# Patient Record
Sex: Male | Born: 1935 | Race: White | Hispanic: No | Smoking: Former smoker
Health system: Southern US, Community
[De-identification: ages and names within clinical notes are randomized; demographics above are authoritative.]

## PROBLEM LIST (undated history)

## (undated) DIAGNOSIS — I10 Essential (primary) hypertension: Secondary | ICD-10-CM

## (undated) DIAGNOSIS — E079 Disorder of thyroid, unspecified: Secondary | ICD-10-CM

## (undated) DIAGNOSIS — I639 Cerebral infarction, unspecified: Secondary | ICD-10-CM

## (undated) HISTORY — PX: CORONARY STENT PLACEMENT: SHX1402

## (undated) HISTORY — DX: Cerebral infarction, unspecified: I63.9

## (undated) HISTORY — DX: Disorder of thyroid, unspecified: E07.9

## (undated) HISTORY — DX: Essential (primary) hypertension: I10

---

## 1994-02-19 ENCOUNTER — Encounter (INDEPENDENT_AMBULATORY_CARE_PROVIDER_SITE_OTHER): Payer: Self-pay | Admitting: *Deleted

## 1994-02-20 ENCOUNTER — Encounter: Payer: Self-pay | Admitting: Gastroenterology

## 1997-05-31 ENCOUNTER — Encounter (INDEPENDENT_AMBULATORY_CARE_PROVIDER_SITE_OTHER): Payer: Self-pay | Admitting: *Deleted

## 2000-07-10 ENCOUNTER — Encounter (INDEPENDENT_AMBULATORY_CARE_PROVIDER_SITE_OTHER): Payer: Self-pay | Admitting: *Deleted

## 2004-06-26 ENCOUNTER — Encounter (INDEPENDENT_AMBULATORY_CARE_PROVIDER_SITE_OTHER): Payer: Self-pay | Admitting: *Deleted

## 2004-10-26 ENCOUNTER — Inpatient Hospital Stay (HOSPITAL_COMMUNITY): Admission: EM | Admit: 2004-10-26 | Discharge: 2004-10-27 | Payer: Self-pay | Admitting: *Deleted

## 2006-08-06 ENCOUNTER — Emergency Department (HOSPITAL_COMMUNITY): Admission: EM | Admit: 2006-08-06 | Discharge: 2006-08-06 | Payer: Self-pay | Admitting: Emergency Medicine

## 2006-08-26 ENCOUNTER — Ambulatory Visit (HOSPITAL_COMMUNITY): Admission: RE | Admit: 2006-08-26 | Discharge: 2006-08-26 | Payer: Self-pay | Admitting: *Deleted

## 2006-10-01 ENCOUNTER — Inpatient Hospital Stay (HOSPITAL_COMMUNITY): Admission: RE | Admit: 2006-10-01 | Discharge: 2006-10-08 | Payer: Self-pay | Admitting: *Deleted

## 2006-10-01 ENCOUNTER — Encounter (INDEPENDENT_AMBULATORY_CARE_PROVIDER_SITE_OTHER): Payer: Self-pay | Admitting: Specialist

## 2006-10-17 ENCOUNTER — Encounter: Payer: Self-pay | Admitting: Gastroenterology

## 2006-10-18 ENCOUNTER — Encounter: Payer: Self-pay | Admitting: Gastroenterology

## 2006-10-18 ENCOUNTER — Inpatient Hospital Stay (HOSPITAL_COMMUNITY): Admission: EM | Admit: 2006-10-18 | Discharge: 2006-10-23 | Payer: Self-pay | Admitting: Emergency Medicine

## 2006-10-20 ENCOUNTER — Encounter: Payer: Self-pay | Admitting: Gastroenterology

## 2006-10-20 ENCOUNTER — Encounter (INDEPENDENT_AMBULATORY_CARE_PROVIDER_SITE_OTHER): Payer: Self-pay | Admitting: *Deleted

## 2006-10-22 ENCOUNTER — Ambulatory Visit: Payer: Self-pay | Admitting: Gastroenterology

## 2006-10-23 ENCOUNTER — Encounter (INDEPENDENT_AMBULATORY_CARE_PROVIDER_SITE_OTHER): Payer: Self-pay | Admitting: *Deleted

## 2006-12-11 ENCOUNTER — Ambulatory Visit: Payer: Self-pay | Admitting: *Deleted

## 2006-12-30 ENCOUNTER — Ambulatory Visit: Payer: Self-pay | Admitting: Gastroenterology

## 2007-06-11 ENCOUNTER — Ambulatory Visit: Payer: Self-pay | Admitting: *Deleted

## 2007-06-11 ENCOUNTER — Encounter: Admission: RE | Admit: 2007-06-11 | Discharge: 2007-06-11 | Payer: Self-pay | Admitting: *Deleted

## 2009-05-30 ENCOUNTER — Encounter (INDEPENDENT_AMBULATORY_CARE_PROVIDER_SITE_OTHER): Payer: Self-pay | Admitting: *Deleted

## 2009-11-10 ENCOUNTER — Telehealth: Payer: Self-pay | Admitting: Gastroenterology

## 2009-11-10 ENCOUNTER — Encounter: Payer: Self-pay | Admitting: Gastroenterology

## 2009-12-07 ENCOUNTER — Ambulatory Visit: Payer: Self-pay | Admitting: Gastroenterology

## 2009-12-07 ENCOUNTER — Encounter (INDEPENDENT_AMBULATORY_CARE_PROVIDER_SITE_OTHER): Payer: Self-pay | Admitting: *Deleted

## 2009-12-07 DIAGNOSIS — Z860100 Personal history of colon polyps, unspecified: Secondary | ICD-10-CM | POA: Insufficient documentation

## 2009-12-07 DIAGNOSIS — I2510 Atherosclerotic heart disease of native coronary artery without angina pectoris: Secondary | ICD-10-CM | POA: Insufficient documentation

## 2009-12-07 DIAGNOSIS — K219 Gastro-esophageal reflux disease without esophagitis: Secondary | ICD-10-CM

## 2009-12-07 DIAGNOSIS — Z8711 Personal history of peptic ulcer disease: Secondary | ICD-10-CM

## 2009-12-14 ENCOUNTER — Encounter: Payer: Self-pay | Admitting: Gastroenterology

## 2010-05-31 ENCOUNTER — Inpatient Hospital Stay (HOSPITAL_COMMUNITY): Admission: EM | Admit: 2010-05-31 | Discharge: 2010-06-05 | Payer: Self-pay | Admitting: Emergency Medicine

## 2010-08-28 ENCOUNTER — Ambulatory Visit (HOSPITAL_COMMUNITY): Admission: RE | Admit: 2010-08-28 | Discharge: 2010-08-28 | Payer: Self-pay | Admitting: Ophthalmology

## 2010-09-11 ENCOUNTER — Ambulatory Visit (HOSPITAL_COMMUNITY): Admission: RE | Admit: 2010-09-11 | Discharge: 2010-09-11 | Payer: Self-pay | Admitting: Ophthalmology

## 2010-11-22 NOTE — Op Note (Signed)
Summary: [facility]   Imported By: Sherian Rein 12/07/2009 14:04:41  _____________________________________________________________________  External Attachment:    Type:   Image     Comment:   External Document

## 2010-11-22 NOTE — Procedures (Signed)
Summary: Colonoscopy   Colonoscopy  Procedure date:  07/10/2000  Findings:      Results: Polyp.  Results: Diverticulosis.       Location:  [facility].    Procedures Next Due Date:    Colonoscopy: 07/2004 Patient Name: Walter [name], Walter [name]. MRN: [id number] Procedure Procedures: Colonoscopy CPT: [phone].    with Hot Biopsy(s)CPT: [id number].  Personnel: Endoscopist: Ulyess Mort, MD.  Referred By: Desma Maxim, MD.  Exam Location: Exam performed in [facility]. Outpatient  Patient Consent: Procedure, Alternatives, Risks and Benefits discussed, consent obtained, from patient.  Indications  Surveillance of: Adenomatous Polyp(s). This is not an initial surveillance exam. 1-2 Polyps were found at Index Exam. Largest polyp removed was 10 to 19 mm. Prior polyp located in distal colon. Pathology of worst  polyp: tubular adenoma.  Exam Exam: Extent of exam reached: Cecum, extent intended: Cecum.  The cecum was identified by appendiceal orifice and IC valve. Patient position: on left side. Duration of exam: 30 minutes. Colon retroflexion performed. Images taken. ASA Classification: II. Tolerance: good.  Monitoring: Pulse and BP monitoring, Oximetry used. Supplemental O2 given.  Colon Prep Used Phospho Soda for colon prep.  Fluoroscopy: Fluoroscopy was not used.  Sedation Meds: Fentanyl 75 mcg. Versed 7.5 mg.  Findings POLYP: Ascending Colon, Maximum size: 4 mm. sessile polyp. Procedure:  hot biopsy, removed, not retrieved, ICD9: Colon Polyps: 211.3.  DIVERTICULOSIS: Descending Colon. ICD9: Diverticulosis: 562.10. Comments: moderate.   Assessment Abnormal examination, see findings above.  Diagnoses: 211.3: Colon Polyps.  562.10: Diverticulosis.   Events  Unplanned Interventions: No intervention was required.  Plans  Post Exam Instructions: Home hemoccult tests to be obtained, CPT: Hm. Hemoccult.  Patient Education: Patient given standard instructions  for: Polyps. Diverticulosis. Patient instructed to get routine colonoscopy every 4 years.  Disposition: After procedure patient sent to recovery. After recovery patient sent home.  This report was created from the original endoscopy report, which was reviewed and signed by the above listed endoscopist.    cc: Donia Guiles, MD

## 2010-11-22 NOTE — Procedures (Signed)
Summary: EGD   EGD  Procedure date:  10/18/2006  Findings:      Findings: Esophagitis  Findings: Ulcer  Location: [facility]    Procedures Next Due Date:    EGD: 12/2006  EGD  Procedure date:  10/18/2006  Findings:      Findings: Esophagitis  Findings: Ulcer  Location: [facility]    Procedures Next Due Date:    EGD: 12/2006 Patient Name: Walter [name], Walter [name]. MRN: [id number] Procedure Procedures: Panendoscopy (EGD) CPT: 43235.  Personnel: Endoscopist: Venita Lick. Russella Dar, MD, Clementeen Graham.  Referred By: Bud Face, MD.  Exam Location: Exam performed in Endoscopy Suite. Inpatient-ICU  Patient Consent: Procedure, Alternatives, Risks and Benefits discussed, consent obtained, from family. Consent was obtained by the RN.  Indications  Evaluation of Suspected: Upper GI bleed.  History  Current Medications: Patient is not currently taking Coumadin.  Pre-Exam Physical: Performed Oct 17, 2006  Cardio-pulmonary exam, HEENT exam, Abdominal exam, Mental status exam WNL.  Comments: Pt. history reviewed/updated, physical exam performed prior to initiation of sedation?Yes Exam Exam Info: Maximum depth of insertion Duodenum, intended Duodenum. Vocal cords not visualized. Gastric retroflexion performed. Images taken. ASA Classification: III. Tolerance: excellent.  Sedation Meds: Patient assessed and found to be appropriate for moderate (conscious) sedation. No sedation was given. The patient was intubated.  Monitoring: BP and pulse monitoring done. Oximetry used. Supplemental O2 given  Findings Normal: Proximal Esophagus to Mid Esophagus.  - BLOOD CLOT: found in Fundus. ICD9: .  ESOPHAGEAL INFLAMMATION: Severity is moderate, erosions present.  [place] Classification: Grade B. ICD9: Esophagitis, Reflux: 530.11.  OTHER FINDING: Linear erythema in Cardia. Comments: c/w NG tube suction marks.  ULCER: in Duodenal Bulb Maximum size: 5 mm. Not bleeding, clear  ulcer base. An image was taken. ICD9: Ulcer, Duodenal, Acute without Hemorrhage: 532.30.  ULCER: in Body Maximum size: 4 mm. Adherent clot, not bleeding. The ulcer was washed with water. An image was taken. ICD9: Ulcer, Gastric, Acute without Hemorrhage: 531.30.  Normal: Antrum to Pyloric Sphincter.  ULCER: in Body Maximum size: 5 mm. Adherent clot, not bleeding. An image was taken. ICD9: Ulcer, Gastric, Acute without Hemorrhage: 531. 30.  Normal: Duodenal 2nd Portion.    Comments: Dark clots and old blood in stomach. The patient was repositioned with an adequate view of all areas except a small portion of the fundus. Assessment  Diagnoses: 531.30: Ulcer, Gastric, Acute without Hemorrhage.  532.30: Ulcer, Duodenal, Acute without Hemorrhage.  530.11: Esophagitis, Reflux.   Events  Unplanned Intervention: No unplanned interventions were required.  Unplanned Events: There were no complications. Plans Medication(s): PPI: IV infusion starting Oct 18, 2006  PPI: BID, starting Oct 21, 2006 for 4 wks.  PPI: QAM, starting Nov 18, 2006 for indefinitely.   Patient Education: Patient given standard instructions for: Ulcer. Reflux.  Comments: DC Naprosyn and avoid all other NSAIDs. Hold ASA and Plavix for at least 2 weeks. Disposition: After procedure patient sent remain in ICU.  Scheduling: EGD, to [employer]. Russella Dar, MD, [facility], around Dec 18, 2006.  Blood Tests, H. pylori Ab, treat if positive Oct 18, 2006.    This report was created from the original endoscopy report, which was reviewed and signed by the above listed endoscopist.    cc: Bud Face, MD         RUT: positive and treated

## 2010-11-22 NOTE — Procedures (Signed)
Summary: Colonoscopy   Colonoscopy  Procedure date:  06/26/2004  Findings:      Results: Diverticulosis.       Location:  [facility].    Procedures Next Due Date:    Colonoscopy: 06/2011  Colonoscopy  Procedure date:  06/26/2004  Findings:      Results: Diverticulosis.       Location:  [facility].    Procedures Next Due Date:    Colonoscopy: 06/2011 Patient Name: Walter [name], Walter [name] MRN: [id number] Procedure Procedures: Colonoscopy CPT: 54098.  Personnel: Endoscopist: Ulyess Mort, MD.  Exam Location: Exam performed in Outpatient Clinic. Outpatient  Patient Consent: Procedure, Alternatives, Risks and Benefits discussed, consent obtained, from patient. Consent was obtained by the RN.  Indications  Surveillance of: Adenomatous Polyp(s).  History  Current Medications: Patient is not currently taking Coumadin.  Pre-Exam Physical: Performed Jun 04, 2002. Cardio-pulmonary exam, Rectal exam, HEENT exam , Abdominal exam, Neurological exam, Mental status exam WNL.  Exam Exam: Extent of exam reached: Cecum, extent intended: Cecum.  The cecum was identified by appendiceal orifice and IC valve. Colon retroflexion performed. Images were not taken. ASA Classification: II. Tolerance: good.  Monitoring: Pulse and BP monitoring, Oximetry used. Supplemental O2 given.  Colon Prep Prep results: fair, exam compromised.  Sedation Meds: Patient assessed and found to be appropriate for moderate (conscious) sedation. Fentanyl 50 mcg. given IV. Versed 5 mg. given IV.  Findings - DIVERTICULOSIS: Ascending Colon to Sigmoid Colon. ICD9: Diverticulosis: 562.10. Comments: moderately severe.   Assessment Abnormal examination, see findings above.  Diagnoses: 562.10: Diverticulosis.   Events  Unplanned Interventions: No intervention was required.  Unplanned Events: There were no complications. Plans Medication Plan: Continue current  medications.  Patient Education: Patient given standard instructions for: Diverticulosis. Yearly hemoccult testing recommended. Patient instructed to get routine colonoscopy every 7 years.  Disposition: After procedure patient sent to recovery. After recovery patient sent home.  This report was created from the original endoscopy report, which was reviewed and signed by the above listed endoscopist.    cc: Donia Guiles, MD

## 2010-11-22 NOTE — Letter (Signed)
Summary: New Patient letter  [facility]  [address] [place], [place] [address]   Phone: [phone]  Fax: [phone]       11/10/2009 MRN: [id number]  [facility] [address] [place], [place]  [address]  Dear Walter [name],  Welcome to the Gastroenterology Division at [facility].    You are scheduled to see Dr.  Claudette Head on 12/07/09 (Thursday) at 10:00 a.m. on the 3rd floor at [facility], [address]  We ask that you try to arrive at our office 15 minutes prior to your appointment time to allow for check-in.  We would like you to complete the enclosed self-administered evaluation form prior to your visit and bring it with you on the day of your appointment.  We will review it with you.  Also, please bring a complete list of all your medications or, if you prefer, bring the medication bottles and we will list them.  Please bring your insurance card so that we may make a copy of it.  If your insurance requires a referral to see a specialist, please bring your referral form from your primary care physician.  Co-payments are due at the time of your visit and may be paid by cash, check or credit card.     Your office visit will consist of a consult with your physician (includes a physical exam), any laboratory testing he/she may order, scheduling of any necessary diagnostic testing (e.g. x-ray, ultrasound, CT-scan), and scheduling of a procedure (e.g. Endoscopy, Colonoscopy) if required.  Please allow enough time on your schedule to allow for any/all of these possibilities.    If you cannot keep your appointment, please call [phone] to cancel or reschedule prior to your appointment date.  This allows [place] the opportunity to schedule an appointment for another patient in need of care.  If you do not cancel or reschedule by 5 p.m. the business day prior to your appointment date, you will be charged a $50.00 late cancellation/no-show fee.     Thank you for choosing [facility] for your medical needs.  We appreciate the opportunity to care for you.  Please visit [place] at our website  to learn more about our practice.                     Sincerely,                                                             The Gastroenterology Division

## 2010-11-22 NOTE — Assessment & Plan Note (Signed)
Summary: discuss colonoscopy on plavix at age 77/dn   History of Present Illness Visit Type: new patient  Primary GI MD: Elie Goody MD [facility] Primary Provider: Lupita Raider, MD  Requesting Provider: n/a Chief Complaint: Consult colon. Pt is on Plavix. Pt denies any GI complaints  History of Present Illness:   Walter [name] is an 77 year old male with a history of adenomatous colon polyps, initially diagnosed in 1995. He has also had bleeding gastric and duodenal ulcers  secondary to NSAID usage with a history of a severe bleed in December 2007. Helicobacter pylori was treated.   GI Review of Systems      Denies abdominal pain, acid reflux, belching, bloating, chest pain, dysphagia with liquids, dysphagia with solids, heartburn, loss of appetite, nausea, vomiting, vomiting blood, weight loss, and  weight gain.        Denies anal fissure, black tarry stools, change in bowel habit, constipation, diarrhea, diverticulosis, fecal incontinence, heme positive stool, hemorrhoids, irritable bowel syndrome, jaundice, light color stool, liver problems, rectal bleeding, and  rectal pain.   Current Medications (verified): 1)  Protonix 40 Mg Tbec (Pantoprazole Sodium) .... One Tablet By Mouth Once Daily 2)  Levothyroxine Sodium 75 Mcg Tabs (Levothyroxine Sodium) .... One Tablet By Mouth Once Daily 3)  Metoprolol Tartrate 50 Mg Tabs (Metoprolol Tartrate) .... One Tablet By Mouth Once Daily 4)  Plavix 75 Mg Tabs (Clopidogrel Bisulfate) .... One Tablet By Mouth Once Daily 5)  Lipitor 40 Mg Tabs (Atorvastatin Calcium) .... One Tablet By Mouth Once Daily 6)  Allopurinol 300 Mg Tabs (Allopurinol) .... One Tablet By Mouth Once Daily  Allergies (verified): 1)  ! Penicillin  Past History:  Past Medical History: Adenomatous Colon Polyps 02/1994 Reflux esophagitis Gastric, Duodenal ulcers with bleed, 09/2006 H. Pylori antibody pos., treated in 09/2006 Diverticulosis Hypothyroidism Coronary Artery  Disease Hyperlipidemia Hypertension Allergic rhinitis  Past Surgical History: AAA repair  T & A Right ear tube Exp laparotomy with small bowel enterotomy 09/2006 Percutaneous coronary intervention, stent placed in 2006  Family History: No FH of Colon Cancer:  Social History: Occupation: Retired Widowed No childern Patient is a former smoker.  Alcohol Use - no Illicit Drug Use - no Smoking Status:  quit Drug Use:  no  Review of Systems       The pertinent positives and negatives are noted as above and in the HPI. All other ROS were reviewed and were negative.   Vital Signs:  Patient profile:   77 year old male Height:      71 inches Weight:      222 pounds BMI:     31.07 BSA:     2.21 Pulse rate:   68 / minute Pulse rhythm:   regular BP sitting:   128 / 74  (left arm) Cuff size:   regular  Vitals Entered By: Ok Anis CMA (December 07, 2009 9:47 AM)  Physical Exam  General:  Well developed, well nourished, no acute distress. Head:  Normocephalic and atraumatic. Eyes:  PERRLA, no icterus. Mouth:  No deformity or lesions, dentition normal. Lungs:  Clear throughout to auscultation. Heart:  Regular rate and rhythm; no murmurs, rubs,  or bruits. Abdomen:  Soft, nontender and nondistended. No masses, hepatosplenomegaly or hernias noted. Normal bowel sounds. Psych:  Alert and cooperative. Normal mood and affect.  Impression & Recommendations:  Problem # 1:  PERSONAL HISTORY OF COLONIC POLYPS (ICD-V12.72) Personal history of adenomatous colon polyps. His overall health status is good and he  would like to proceed with surveillance colonoscopy. The risks, benefits, and alternatives to 5 day hold Plavix were discussed with the patient and he consents to proceed. This will be cleared through his cardiologist. The risks, benefits and alternatives to colonoscopy with possible biopsy and possible polypectomy were discussed with the patient and they consent to proceed. The  procedure will be scheduled electively. Orders: Colonoscopy (Colon)  Problem # 2:  PEPTIC ULCER, ACUTE, HEMORRHAGE, HX OF (ICD-V12.71) Long-term avoidance of ASA and NSAID products if at all possible. If his cardiologist felt an 81 mg strength aspirin would be beneficial this would be reasonable as long as he continued a proton pump inhibitor on a daily basis. I would avoid any higher strength aspirin products or any other NSAID products long term. Continue Protonix 40 mg daily, long-term.  Problem # 3:  CORONARY ARTERY DISEASE (ICD-414.00) Priorcoronary artery stent placement. See problem #1.  Patient Instructions: 1)  Colonoscopy brochure given.  2)  Conscious Sedation brochure given.  3)  Copy sent to : Lupita Raider, MD 4)                         [name], MD 5)  The medication list was reviewed and reconciled.  All changed / newly prescribed medications were explained.  A complete medication list was provided to the patient / caregiver.  Prescriptions: MOVIPREP 100 GM  SOLR (PEG-KCL-NACL-NASULF-NA ASC-C) As per prep instructions.  #1 x 0   Entered by:   Christie Nottingham CMA (AAMA)   Authorized by:   Meryl Dare MD [facility]   Signed by:   Meryl Dare MD FACG on 12/07/2009   Method used:   Electronically to        [facility] (retail)       [address]       [place], [place]  [address]       Ph: [phone]       Fax: [phone]   RxID:   [id number]

## 2010-11-22 NOTE — Letter (Signed)
Summary: [facility] Instructions  [facility]  [address] [place], [place] [address]   Phone: [phone]  Fax: [phone]       Walter [name]    August 18, 1924    MRN: [id number]        Procedure Day /Date: Tuesday March 8th, 2011     Arrival Time: 3:00pm     Procedure Time: 4:00pm     Location of Procedure:                    _x _  [facility] (4th Floor)                        PREPARATION FOR COLONOSCOPY WITH MOVIPREP   Starting 5 days prior to your procedure 12/21/09  do not eat nuts, seeds, popcorn, corn, beans, peas,  salads, or any raw vegetables.  Do not take any fiber supplements (e.g. Metamucil, Citrucel, and Benefiber).  THE DAY BEFORE YOUR PROCEDURE         DATE: 12/25/09   DAY:  Monday   1.  Drink clear liquids the entire day-NO SOLID FOOD  2.  Do not drink anything colored red or purple.  Avoid juices with pulp.  No orange juice.  3.  Drink at least 64 oz. (8 glasses) of fluid/clear liquids during the day to prevent dehydration and help the prep work efficiently.  CLEAR LIQUIDS INCLUDE: Water Jello Ice Popsicles Tea (sugar ok, no milk/cream) Powdered fruit flavored drinks Coffee (sugar ok, no milk/cream) Gatorade Juice: apple, white grape, white cranberry  Lemonade Clear bullion, consomm, broth Carbonated beverages (any kind) Strained chicken noodle soup Hard Candy                             4.  In the morning, mix first dose of MoviPrep solution:    Empty 1 Pouch A and 1 Pouch B into the disposable container    Add lukewarm drinking water to the top line of the container. Mix to dissolve    Refrigerate (mixed solution should be used within 24 hrs)  5.  Begin drinking the prep at 5:00 p.m. The MoviPrep container is divided by 4 marks.   Every 15 minutes drink the solution down to the next mark (approximately 8 oz) until the full liter is complete.   6.  Follow completed prep with 16 oz of clear liquid of your choice  (Nothing red or purple).  Continue to drink clear liquids until bedtime.  7.  Before going to bed, mix second dose of MoviPrep solution:    Empty 1 Pouch A and 1 Pouch B into the disposable container    Add lukewarm drinking water to the top line of the container. Mix to dissolve    Refrigerate  THE DAY OF YOUR PROCEDURE      DATE:  12/26/09  DAY:  Tuesday  Beginning at   11:00 a.m. (5 hours before procedure):         1. Every 15 minutes, drink the solution down to the next mark (approx 8 oz) until the full liter is complete.  2. Follow completed prep with 16 oz. of clear liquid of your choice.    3. You may drink clear liquids until  2:00pm  (2 HOURS BEFORE PROCEDURE).   MEDICATION INSTRUCTIONS  Unless otherwise instructed, you should take regular prescription medications with a small sip of  water   as early as possible the morning of your procedure.  Diabetic patients - see separate instructions.  Stop taking Plavix or Aggrenox on  _  _  (5 days before procedure).     Stop taking Coumadin on  _ _  (5 days before procedure).  Additional medication instructions: You will be contacted by our office prior to your procedure for directions on holding your Plavix.  If you do not hear from our office 1 week prior to your scheduled procedure, please call [phone] to discuss.          OTHER INSTRUCTIONS  You will need a responsible adult at least 75 years of age to accompany you and drive you home.   This person must remain in the waiting room during your procedure.  Wear loose fitting clothing that is easily removed.  Leave jewelry and other valuables at home.  However, you may wish to bring a book to read or  an iPod/MP3 player to listen to music as you wait for your procedure to start.  Remove all body piercing jewelry and leave at home.  Total time from sign-in until discharge is approximately 2-3 hours.  You should go home directly after your procedure and rest.   You can resume normal activities the  day after your procedure.  The day of your procedure you should not:   Drive   Make legal decisions   Operate machinery   Drink alcohol   Return to work  You will receive specific instructions about eating, activities and medications before you leave.    The above instructions have been reviewed and explained to me by   _______________________    I fully understand and can verbalize these instructions _____________________________ Date _________

## 2010-11-22 NOTE — Procedures (Signed)
Summary: [employer]   Imported By: Sherian Rein 12/07/2009 14:07:28  _____________________________________________________________________  External Attachment:    Type:   Image     Comment:   External Document

## 2010-11-22 NOTE — Letter (Signed)
Summary: Anticoagulation Modification Letter  [facility]  [address] [place], [place] [address]   Phone: [phone]  Fax: [phone]    December 07, 2009  Re:    Walter [name] DOB:    07/12/1925 MRN:    [id number]    Dear Dr. Eldridge Dace,  We have scheduled the above patient for an endoscopic procedure. Our records show that  he/she is on anticoagulation therapy. Please advise as to how long the patient may come off their therapy of Plavix prior to the scheduled procedure(s) on 12/26/09.   Please fax back/or route the completed form to [place] at [phone].  Thank you for your help with this matter.  Sincerely,  Christie Nottingham CMA [facility])   Physician Recommendation:  Hold Plavix 7 days prior ________________  Hold Coumadin 5 days prior ____________  Other ______________________________     Appended Document: Anticoagulation Modification Letter Pt informed to come off Plavix 5 days before procedure per Dr. Hoyle Barr orders. Pt verbalized understanding.

## 2010-11-22 NOTE — Progress Notes (Signed)
Summary: Schedule Appt to discuss Colonoscopy   Phone Note Outgoing Call   Call placed by: Hortense Ramal CMA [facility]),  November 10, 2009 12:19 PM Call placed to: Patient Summary of Call: I have called to advise patient that it is time for his recall colonoscopy due to age and due to his previous history of diverticulosis. Since patient is 77 years old and is currently taking plavix, I have set him up for an office visit to discuss. Initial call taken by: Hortense Ramal CMA [facility]),  November 10, 2009 12:19 PM

## 2010-11-22 NOTE — Procedures (Signed)
Summary: [employer] [facility]   Imported By: Sherian Rein 12/07/2009 14:06:14  _____________________________________________________________________  External Attachment:    Type:   Image     Comment:   External Document

## 2010-11-22 NOTE — Procedures (Signed)
Summary: Plavix/[facility]  Plavix/[facility]   Imported By: Lester  12/18/2009 09:18:56  _____________________________________________________________________  External Attachment:    Type:   Image     Comment:   External Document

## 2010-11-22 NOTE — Procedures (Signed)
Summary: EGD   EGD  Procedure date:  10/17/2006  Findings:      Findings: Normal  Location: [facility]   Patient Name: Walter [name], Walter [name]. MRN: [id number] Procedure Procedures: Panendoscopy (EGD) CPT: 43235.  Personnel: Endoscopist: Barbette Hair. Arlyce Dice, MD.  Indications Symptoms: Melena.  Comments: s/p AAA repair 3 weeks ago History  Current Medications: Patient is not currently taking Coumadin.  Pre-Exam Physical: Performed Oct 17, 2006  Entire physical exam was normal.  Exam Exam Info: Maximum depth of insertion Duodenum, intended Duodenum. Vocal cords visualized. Gastric retroflexion performed. ASA Classification: II. Tolerance: excellent.  Sedation Meds: Fentanyl 25 mcg. given IV. Versed 2 mg. given IV. Cetacaine Spray 2 sprays given aerosolized.  Monitoring: BP and pulse monitoring done. Oximetry used. Supplemental O2 given at 2 Liters.  Findings - Normal: Proximal Esophagus to Duodenal 2nd Portion. Comments: Large amount of brown fluid along greater curvature.  No fresh or old blood.   Assessment Normal examination.  Events  Unplanned Intervention: No unplanned interventions were required.  Unplanned Events: There were no complications. Plans Medication(s): Continue current medications.  Scheduling: CT Scan, ASAP Oct 17, 2006.    This report was created from the original endoscopy report, which was reviewed and signed by the above listed endoscopist.    cc: Bud Face, MD

## 2010-11-22 NOTE — Discharge Summary (Signed)
Summary: GI Bleed, Acute Hypotension, Anemia   NAME:  Walter [name], Walter [name]                ACCOUNT NO.:  st   [name]:  [id number]          [name] TYPE:  INP   LOCATION:  2001                         FACILITY:  [facility]   PHYSICIAN:  Jerold Coombe, P.A.DATE OF BIRTH:  [birth year]   DATE OF ADMISSION:  DATE OF DISCHARGE:  10/23/2006                               DISCHARGE SUMMARY   ADMISSION DIAGNOSES:  1. Gastrointestinal bleed with melena.  2. Acute hypotension.  3. Anemia.   DISCHARGE/SECONDARY DIAGNOSES:  1. Acute gastric ulcer.  2. Acute duodenal ulcer.  3. Reflux esophagitis.  4. Acute blood loss anemia secondary to gastrointestinal bleed.  5. Acute gastrointestinal bleed secondary to duodenal and gastric      ulcer, also with history of non-steroidal anti-inflammatory use.  6. Positive H. pylori antibody.  7. Hypotension with shock, resolved.  8. History of hypothyroidism.  9. History of abdominal aortic aneurysm, status post repair on      October 01, 2006 by Dr. Denman George.  10.Coronary artery disease status post percutaneous coronary      intervention in January 2006.  11.Dyslipidemia.  12.Hypertension.  13.Allergic rhinitis.  14.History of tonsillectomy and adenoidectomy in 1934.  15.History of right ear tube in 2004.  16.Intraoperative small bowel enterotomy status post closure.  17.Osteoarthritis of Walter right knee.  18.Postoperative hyperglycemia, improved.  (Hemoglobin A1c 5.9).   PROCEDURES:  1. August 17, 2006:  A esophagogastroduodenoscopy by Walter [name] showing no active bleed.  2. October 18, 2006:  Laparotomy and closure of small bowel      enterotomy by Dr. Madilyn [name].  3. October 18, 2006:  Esophagogastroduodenoscopy by Dr. Claudette Head      showing acute duodenal and gastric ulcerations without hemorrhage      and evidence of reflux esophagitis.   BRIEF HISTORY:  Walter [name] was a 77 year old white male who was  recently discharged from [facility] on October 18, 2006 after  repair of his 6-cm infrarenal abdominal aortic aneurysm by Dr. Denman George.  On December 28, Walter [name] was in his usual state of  health, recovering from his surgery, when he had one bowel movement  followed by two further bowel movements both which were black in color.  Walter initial stool was solid and then he started to pass more loose  stools thereafter.  Walter [name] presented to Walter emergency department  and had a large loose black stool there which was heme positive.  On  exam he was pale, tachycardiac and hypotensive.  He was initially  admitted by Walter [facility] and a GI consult was ordered  for further evaluation of his GI bleed with melena.   HOSPITAL COURSE:  Walter [name] was seen in Walter emergency department for  GI bleed on October 17, 2006.  As mentioned, he was hypotensive and  anemic with a hemoglobin of 6.5 requiring a transfusion and IV fluids.  He had not been on Coumadin therapy but had been on Naprosyn as well as  Plavix  and aspirin.  He underwent an EGD which did not reveal any blood  in Walter stomach, first or second part of Walter duodenum.  Initially, a CT  scan was desired with Walter negative EGD but Walter [name] became too  unstable and Walter [name] was consulted and felt Dr. Amie Critchley  should be taken emergently to Walter operating room to rule out possible  acute aortoenteric fistula.  After discussing plans with Walter [name] and  family, he was taken emergently to Walter operating room and exploratory  laparotomy was performed.  Findings did show blood in Walter nasogastric  tube and a suspected gastric clot was palpated.  No evidence of an  aortoenteric fistula was noted.  Intraoperatively there was a small  bowel enterotomy which did require closure and a general Walter, Dr.  Lindie Spruce, did assist intraoperatively.  Postoperatively, Walter [name]  remained intubated and  transferred to Walter surgical intensive care unit.  He did require 6 units of packed red blood cells intraoperatively.  Dr.  Madilyn [name] discussed Walter intraoperative findings with Dr. Russella Dar, Walter on call  gastroenterologist.  He recommended re-scoping Walter [name] which was  done on October 18, 2006, this time showing evidence of dark clots and  old blood in Walter stomach.  Gastric and duodenal ulcers were noted as  well as evidence of reflux esophagitis.  By this point, his Naprosyn,  aspirin and Plavix had all been placed on hold.  An H. pylori test was  ordered which showed Walter antibody elevated at 1.1.  He was started on  Biaxin, Metronidazole and Protonix.  Postoperatively, Walter [name] was  monitored closely in Walter intensive care unit.  He was extubated by  October 19, 2006 with a stable hemoglobin and hematocrit of 11 and 31,  respectively.  Over Walter next few days his diet was slowly advanced and  nasogastric tube discontinued.  By October 21, 2006, he had been  tolerating a full liquid diet and remained hemodynamically stable.  His  abdominal distention was improving and he was beginning to have bowel  movements.  It was felt appropriate to transfer him to telemetry unit  2000 where it is anticipated he will remain until discharge.  His diet  was subsequently advanced to a heart healthy diet.  He was able to  ambulate well and pain was controlled on oral medication.  His incisions  appeared to be healing well without signs of infection.  He remained  afebrile with stable vital signs although with mild hypertension with  systolic blood pressure around 140.  His Toprol was resumed.  He  maintained sinus rhythm in Walter 80s and oxygen saturations of 94% on room  air.  If Walter [name] continues to tolerate a regular diet and labs  remain stable, it is anticipated he will be ready for discharge home on  October 23, 2006.   LABORATORY DATA:  Most recent labs as of October 22, 2006 show a C INR  of 1.1, white blood count of 7.9, hemoglobin 9.4, hematocrit 27.6, platelet  count of 204, PTT of 26, triglycerides of 84, H. pylori antibodies IGG  elevated at 1.1, sodium 135, potassium 3.7, chloride 108, CO2 23, blood  glucose 131, BUN 32, creatinine 0.9, calcium 7.1, hemoglobin A1c of 5.9.  Negative urinalysis on October 18, 2006.  Triglycerides 117 and total  bilirubin of 1.3, alkaline phosphatase of 27, AST 18, ALT 19, total  protein 3.2, blood albumin 1.6.  Of note, he also was treated for  hypoglycemia postoperatively and treated with NovoLog insulin sliding  scale q a.c. and h.s.  Moreover, this was continued at discharge as his  non-fasting sugars were between 100-150.   DISCHARGE MEDICATIONS:  1. Biaxin 250 mg p.o. b.i.d. times seven days.  2. Metronidazole 500 mg 1 tablet p.o. b.i.d. times seven days.  3. Protonix 40 mg 1 p.o. b.i.d.  4. Ultram 50 mg 1-2 tablets p.o. q 4-6 hours p.r.n. pain.  5. Synthroid 75 mcg p.o. daily.  6. Toprol XL 25 mg daily.  7. Vytorin 10/40 mg p.o. daily.  8. He is instructed to hold his Plavix and aspirin for at least two      weeks until cleared by Dr. Russella Dar at his followup appointment.  9. He was also instructed to stop his Naprosyn and avoid other NSAIDS.   DISCHARGE INSTRUCTIONS:  He is to follow a low salt, low fat diet and he  may shower and clean his incisions gently with soap and water.  He  should notify Walter CVTS office if he develops fever greater than 101 or  redness or drainage from any of his incision sites or persistent nausea  or vomiting.  He should increase his activity slowly and was encouraged  to continue daily walking and breathing exercises.  He should avoid  driving or heavy lifting for Walter next three weeks.   FOLLOWUP:  He is to followup with Dr. Madilyn [name] at Walter CVTS office on  November 06, 2006 at 10:00 a.m. and has a staple removal appointment at  CVTS office on October 28, 2006 at 9:00 a.m.  He is to call Dr. Ardell Isaacs   office to schedule an EGD for late February or early March.      Jerold Coombe, P.A.     AWZ/MEDQ  D:  10/22/2006  T:  10/22/2006  Job:  [id number]   cc:   Melissa L. Ladona Ridgel, MD  Corky Crafts, MD  Venita Lick. Russella Dar, MD, Yves Dill, M.D.

## 2010-11-22 NOTE — Procedures (Signed)
Summary: EGD   EGD  Procedure date:  12/30/2006  Findings:      Findings: Duodenitis  [facility]   Patient Name: Walter [name], Walter [name]. MRN: [id number] Procedure Procedures: Panendoscopy (EGD) CPT: 43235.    with biopsy(s)/brushing(s). CPT: D1846139.  Personnel: Endoscopist: Venita Lick. Russella Dar, MD, Clementeen Graham.  Exam Location: Exam performed in Outpatient Clinic. Outpatient  Patient Consent: Procedure, Alternatives, Risks and Benefits discussed, consent obtained, from patient. Consent was obtained by the RN.  Indications  Surveillance of: Prior gastric ulcer.  History  Current Medications: Patient is not currently taking Coumadin.  Pre-Exam Physical: Performed Dec 30, 2006  Cardio-pulmonary exam, HEENT exam, Abdominal exam, Mental status exam WNL.  Comments: Pt. history reviewed/updated, physical exam performed prior to initiation of sedation?Yes Exam Exam Info: Maximum depth of insertion Duodenum, intended Duodenum. Vocal cords not visualized. Gastric retroflexion performed. ASA Classification: II. Tolerance: excellent.  Sedation Meds: Patient assessed and found to be appropriate for moderate (conscious) sedation. Fentanyl 25 mcg. given IV. Versed 4 mg. given IV. Cetacaine Spray 2 sprays given aerosolized.  Monitoring: BP and pulse monitoring done. Oximetry used. Supplemental O2 given  Findings Normal: Proximal Esophagus to Distal Esophagus.  MUCOSAL ABNORMALITY: Duodenal Bulb. Erythematous mucosa. ICD9: Duodenitis without Hemorrhage: 535.60. Comment: mild.  Normal: Cardia to Pyloric Sphincter. Biopsy/Normal taken. Comments: for RUT.  Normal: Duodenal 2nd Portion.    Comments: The 2 gastric body ulcer and the duodenal ulcer have all healed. Assessment  Diagnoses: 535.60: Duodenitis without Hemorrhage.   Events  Unplanned Intervention: No unplanned interventions were required.  Unplanned Events: There were no  complications. Plans Instructions: Restart medications: ASA 81 mg qd and Plavix 75 mg qd today.  Medication(s): Await pathology. PPI: Pantoprazole/Protonix 40 mg QAM, for indefinitely.   Patient Education: Patient given standard instructions for: Mucosal Abnormality.  Comments: He will need to remain on a daily PPI forever. Disposition: After procedure patient sent to recovery. After recovery patient sent home.  Scheduling: Referring provider, to Bud Face, MD, as planned  Office Visit, to [facility] T. Russella Dar, MD, Clementeen Graham, prn  Colonoscopy, to [facility] T. Russella Dar, MD, [facility], around Jun 26, 2009.    This report was created from the original endoscopy report, which was reviewed and signed by the above listed endoscopist.    cc: Bud Face, MD   RUT: Negative

## 2011-01-01 LAB — POCT I-STAT 4, (NA,K, GLUC, HGB,HCT)
HCT: 39 % (ref 39.0–52.0)
Hemoglobin: 13.3 g/dL (ref 13.0–17.0)

## 2011-01-03 LAB — GLUCOSE, CAPILLARY: Glucose-Capillary: 108 mg/dL — ABNORMAL HIGH (ref 70–99)

## 2011-01-04 LAB — URINALYSIS, ROUTINE W REFLEX MICROSCOPIC
Glucose, UA: NEGATIVE mg/dL
Specific Gravity, Urine: 1.006 (ref 1.005–1.030)
pH: 6 (ref 5.0–8.0)

## 2011-01-04 LAB — GLUCOSE, CAPILLARY
Glucose-Capillary: 109 mg/dL — ABNORMAL HIGH (ref 70–99)
Glucose-Capillary: 112 mg/dL — ABNORMAL HIGH (ref 70–99)
Glucose-Capillary: 123 mg/dL — ABNORMAL HIGH (ref 70–99)
Glucose-Capillary: 123 mg/dL — ABNORMAL HIGH (ref 70–99)
Glucose-Capillary: 150 mg/dL — ABNORMAL HIGH (ref 70–99)
Glucose-Capillary: 92 mg/dL (ref 70–99)
Glucose-Capillary: 98 mg/dL (ref 70–99)
Glucose-Capillary: 99 mg/dL (ref 70–99)

## 2011-01-04 LAB — URINE CULTURE
Colony Count: NO GROWTH
Culture  Setup Time: 201108112242

## 2011-01-04 LAB — CBC
Hemoglobin: 12.5 g/dL — ABNORMAL LOW (ref 13.0–17.0)
MCH: 29.9 pg (ref 26.0–34.0)
MCHC: 33.7 g/dL (ref 30.0–36.0)
MCV: 88.8 fL (ref 78.0–100.0)
Platelets: 149 10*3/uL — ABNORMAL LOW (ref 150–400)

## 2011-01-04 LAB — DIFFERENTIAL
Basophils Relative: 0 % (ref 0–1)
Eosinophils Absolute: 0.1 10*3/uL (ref 0.0–0.7)
Eosinophils Relative: 1 % (ref 0–5)
Monocytes Relative: 9 % (ref 3–12)
Neutrophils Relative %: 67 % (ref 43–77)

## 2011-01-04 LAB — BASIC METABOLIC PANEL
CO2: 27 mEq/L (ref 19–32)
Calcium: 9.3 mg/dL (ref 8.4–10.5)
Chloride: 101 mEq/L (ref 96–112)
Creatinine, Ser: 1.2 mg/dL (ref 0.4–1.5)
Glucose, Bld: 107 mg/dL — ABNORMAL HIGH (ref 70–99)

## 2011-01-04 LAB — MRSA PCR SCREENING: MRSA by PCR: NEGATIVE

## 2011-01-04 LAB — POCT CARDIAC MARKERS
CKMB, poc: 1.7 ng/mL (ref 1.0–8.0)
Troponin i, poc: <0.05 ng/mL (ref 0.00–0.09)

## 2011-01-04 LAB — PROTIME-INR: INR: 0.96 (ref 0.00–1.49)

## 2011-03-05 NOTE — Assessment & Plan Note (Signed)
OFFICE VISIT   ASCENCION, COYE  DOB:  Oct 07, 1925                                       06/11/2007  [id number]#:[id number]   The patient returns today for final followup after undergoing abdominal  aortic aneurysm repair 10/01/2006.  He did require readmission to the  hospital on 10/17/2006 with an upper GI bleed.  Initial upper GI  endoscopy was negative and he underwent laparotomy to rule out an  aortoenteric fistula.  Fortunately, there was no evidence of  aortoenteric fistula and subsequent upper GI endoscopy verified the  gastric ulcer.   He has been doing well since that time.  He has noted improvement in his  energy level.  He is now back to his baseline.  No major complaints at  this time.   BP 126/76, pulse 67 per minute, respirations 18 per minute.  ABDOMEN:  Soft and nontender.  Midline incision well-healed.  2+ femoral pulses  bilaterally.   CT scan was performed prior to this visit and there is no evidence of  complicating features of his AAA repair.   The patient discharged for further scheduled followup.   Balinda Quails, M.D.  Electronically Signed   PGH/MEDQ  D:  06/11/2007  T:  06/13/2007  Job:  [id number]   cc:   Donia Guiles, M.D.

## 2011-03-08 NOTE — Op Note (Signed)
[name], [name]:  [id number]   [name]:  [id number]          PATIENT TYPE:  INP   LOCATION:  2305                         FACILITY:  [facility]   PHYSICIAN:  Balinda Quails, M.D.    DATE OF BIRTH:  Jul 13, 1925   DATE OF PROCEDURE:  DATE OF DISCHARGE:                               OPERATIVE REPORT   SURGEON:  P Bud Face, MD   ASSISTANT:  Megan Mans, MD, Velora Heckler, MD and Constance Holster, [place].   ANESTHETIC:  General endotracheal.   ANESTHESIOLOGIST:  Sampson Goon.   PREOPERATIVE DIAGNOSIS:  Massive  upper GI bleed with possible  aortoenteric fistula.   POSTOPERATIVE DIAGNOSES:  1. Massive upper GI bleed.  2. No evidence of aortoenteric fistula.  3. Small  bowel enterotomy.   PROCEDURE:  1. Laparotomy.  2. Closure of small bowel enterotomies.   CLINICAL NOTE:  [name] is a 77 year old male, status post  abdominal aortic aneurysm repair on October 03, 2006.  He presented to  the emergency department late in the evening of October 18, 2006 with  findings consistent with a massive upper GI bleed.  The patient had a  hemoglobin of 6.5, melenic stools.  Hypotension.  He was seen initially  by the emergency physician, referred to gastroenterology.  Dr. Melvia Heaps performed an upper GI endoscopy.  This failed to reveal any blood  the stomach, first or second part of the duodenum.  Out of concern for  possible acute aortoenteric fistula, plans were to perform a CT scan.  The patient however became hemodynamically unstable and therefore is  brought to the operating at this time for laparotomy to rule out  aortoenteric fistula.   OPERATIVE PROCEDURE:  The patient brought to the operating room in  moderately unstable condition.  Responded readily to fluid  resuscitation.  Appropriate lines were placed including central venous  line and arterial line.  General endotracheal anesthesia induced.  A  nasogastric tube was then  placed in the stomach there was immediately  return of approximately a liter of fairly fresh blood from the stomach.   The patient responded well to fluid and blood resuscitation.   Midline skin incision was made through the previous incision.  This was  extended from xiphoid to pubis.  Subcutaneous sutures were incised.  The  fascial sutures were incised.   The fascia was then separated.  During separation the fascia, there was  a fresh adhesion of small bowel stuck to the midportion of the fascia  and this resulted in a tear in the mid jejunum.  This was closed with a  GIA stapler and TA 60 stapler.  There was no significant contamination.   Evaluation of the abdominal cavity initially revealed no free blood in  the abdomen.  There was obvious blood in the distal small bowel as  evidenced by the darkness of the bowel color.  The proximal portion of  the jejunum however was free of blood.  There is no evidence of blood  initially in the duodenum.   The adhesions were difficult to divide due  to the 2-week nature of them.  However, the small bowel was freed, brought to the right transverse  colon brought superiorly.  The ligament of Treitz was identified.  The  duodenum was dissected off of the neck of the aneurysm.  There was no  evidence of infection.  Everything was well incorporated.  No odor or  murky fluid.  The third and fourth parts of the duodenum were completely  mobilized off of the neck of the aneurysm without evidence of  communication by aortoenteric fistula.  Some small serosal tears were  created in the second or third part of duodenum.  These were easily  repaired with interrupted 3-0 silk serosal sutures.  No enterotomy was  created in the duodenum.   Further laparotomy carried out.  The small bowel run in its entirety,  there is no evidence of Meckel's diverticulum or mass.  The large bowel  was free of masses.   Evaluation of stomach, however did reveal there to  be likely blood in  the fundus of the stomach.  This was broken up digitally and irrigated  through the nasogastric tube was much as possible.  The liver and  gallbladder appeared normal.   At this time satisfied that there was no aortoenteric fistula, the  midline fascia was then closed with running #1 Prolene suture.  Subcutaneous tissue loosely reapproximated with interrupted 3-0 Vicryl  suture.  Skin closed with staples.   The patient will remain ventilated overnight.  Nasogastric tube was left  in place for irrigation.  In the morning gastroenterology will be again  contacted for repeat endoscopy.      Balinda Quails, M.D.  Electronically Signed     PGH/MEDQ  D:  10/20/2006  T:  10/20/2006  Job:  [id number]   cc:   Barbette Hair. Arlyce Dice, MD,FACG

## 2011-03-08 NOTE — Cardiovascular Report (Signed)
NAMEAN, SCHNABEL NO.:  [id number]   [name]:  [id number]          PATIENT TYPE:  INP   LOCATION:  1828                         FACILITY:  [facility]   PHYSICIAN:  Meade Maw, M.D.    DATE OF BIRTH:  1925-04-09   DATE OF PROCEDURE:  10/26/2004  DATE OF DISCHARGE:                              CARDIAC CATHETERIZATION   INDICATION FOR PROCEDURE:  Cardiolite demonstrating reversible ischemia in  the anteroseptal region.   PROCEDURE:  After obtaining written informed consent, the patient was  brought to the cardiac catheterization laboratory in the postabsorptive  state.  Preoperative sedation was achieved using Versed 2 mg IV.  The right  groin was prepped and draped in the usual sterile fashion.  Local anesthesia  was achieved using 1% Xylocaine.  A 6-French hemostasis sheath was placed  into the right femoral artery using the modified Seldinger technique.  Selective coronary angiography was performed using  JL-4 and JR-4 Judkins  catheter.  Multiple views were obtained. All catheter exchanges were made  over a guide wire.  A single plane ventriculogram was performed in the RAO  position using the 6-French pigtail curved catheter.  The distal aorta was  noted to be markedly tortuous.  Fluoroscopy revealed mild calcification in  the proximal LAD.  Single plane ventriculogram revealed normal wall motion,  ejection fraction of 60-65%.  There was post-PVC MR only.  The aortic  pressure was 115/66.  LV pressure was 117/2 with an EDP of 6.   CORONARY ANGIOGRAPHY:  The left main coronary artery bifurcates into the  left anterior descending and circumflex vessel.  There was no significant  disease noted in the left main coronary artery.  The left anterior  descending contains a subtotal proximal occlusion.  There is post-stenotic  ectasia noted.  The distal LAD contains luminal irregularities only.  Circumflex vessel:  The circumflex vessel was a moderate size  vessel giving  rise to a trivial OM-1 and OM-2 and ends as a trifurcating posterolateral  branch.  There is no disease noted in the circumflex or its branches.  Right  coronary artery:  The right coronary artery is dominant for the posterior  circulation giving rise to trivial RV marginals, large trifurcating  posterolateral branch, and a moderate size PDA.  There is no disease noted  in the right coronary artery or its branches.   FINAL IMPRESSION:  1.  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  2.  Normal wall motion, ejection fraction 60-65% with an end diastolic      pressure of 6.  3.  Dr. Verdis Prime was consulted and will proceed with percutaneous      revascularization of the circumflex vessel.      HP/MEDQ  D:  10/26/2004  T:  10/26/2004  Job:  [id number]

## 2011-03-08 NOTE — Op Note (Signed)
[name], Walter [name]            ACCOUNT NO.:  [id number]   [name]:  [id number]          PATIENT TYPE:  AMB   LOCATION:  SDS                          FACILITY:  [facility]   PHYSICIAN:  Balinda Quails, M.D.    DATE OF BIRTH:  04-18-1925   DATE OF PROCEDURE:  08/26/2006  DATE OF DISCHARGE:  08/26/2006                               OPERATIVE REPORT   SURGEON:  Balinda Quails, M.D.   DIAGNOSIS:  Abdominal aortic aneurysm.   PROCEDURE:  Abdominal aortogram with pelvic runoff arteriography.   ACCESS:  Right common femoral artery; 5-French sheath.   CONTRAST:  125 mL Visipaque.   COMPLICATIONS:  None apparent   PROCEDURE NOTE:  The patient was brought to the catheterization lab in  stable condition.  Placed in the supine position.  Both groins were  prepped and draped in a sterile fashion.  Skin and subcutaneous tissues  were instilled with 1% Xylocaine in the right groin.  An 18 gauge needle  introduced into the right common femoral artery.  A 0.035 J-wire passed  through the needle and into the mid abdominal aorta under fluoroscopy.  The site was opened with an 11 blade.  The needle was removed, a 5-  [place] sheath advanced over guidewire, the dilator removed and the  sheath flushed with heparin-saline solution.  A graduated pigtail  catheter was advanced over the guidewire to the mid abdominal aorta.  Standard AP mid abdominal aortogram obtained.  This revealed single  widely patent renal arteries.  The superior mesenteric artery was also  widely patent.  Celiac axis was intact.  The infrarenal aorta revealed  marked angulation of the aneurysmal neck.  There was a large infrarenal  abdominal aortic aneurysm present.  This extended down to the common  iliac arteries bilaterally.  The common iliac arteries revealed mild  ectasia.  There was mild plaque disease of the common iliac arteries.  There was no iliac stenosis.  There was brisk flow to the common femoral  level  bilaterally.  The hypogastric arteries were intact bilaterally.   This completed the arteriogram procedure.  The guidewire was reinserted  and pigtail catheter removed.  Right femoral sheath removed.  No  apparent complications.   FINAL IMPRESSION:  1. large infrarenal abdominal aortic aneurysm.  2. Widely patent bilateral renal arteries.  3. No significant iliac occlusive disease.   DISPOSITION:  These results will be reviewed further with the patient  and family, and final decision made regarding definitive management of  this infrarenal aortic aneurysm.      Balinda Quails, M.D.  Electronically Signed     PGH/MEDQ  D:  01/23/2007  T:  01/23/2007  Job:  [id number]

## 2011-03-08 NOTE — Op Note (Signed)
NAMEBRENDAN, [name]            ACCOUNT NO.:  [id number]   [name]:  [id number]          PATIENT TYPE:  INP   LOCATION:  2304                         FACILITY:  [facility]   PHYSICIAN:  Balinda Quails, M.D.    DATE OF BIRTH:  10-23-1924   DATE OF PROCEDURE:  10/01/2006  DATE OF DISCHARGE:                               OPERATIVE REPORT   SURGEON:  P Bud Face, MD   ASSISTANT:  Rowe Clack, P.A.-C.   ANESTHESIA:  General endotracheal.   ANESTHESIOLOGIST:  Janetta Hora. Gelene Mink, M.D.   PREOPERATIVE DIAGNOSIS:  6 cm infrarenal abdominal aortic aneurysm.   POSTOPERATIVE DIAGNOSIS:  6 cm infrarenal abdominal aortic aneurysm.   PROCEDURE:  Repair of 6-cm infrarenal abdominal aortic aneurysm with  straight Dacron aortic graft.   CLINICAL NOTE:  Walter [name] is a 77 year old male referred for  evaluation of a large abdominal aortic aneurysm.  He was seen and  underwent evaluation.  Stress test was completed.  CT scan arteriography  revealed quite marked tortuosity of the proximal neck which precluded  placement of an aortic stent graft.  He is brought to the operating at  this time for open repair.  The risks and benefits of the operative  procedure were explained to the patient in detail with the measured  morbidity and mortality 3-5% to include but not limited to MI, CVA,  renal failure, limb loss, infection, bleeding, respiratory failure and  death.   OPERATIVE PROCEDURE:  The patient was brought to the operating room in  stable hemodynamic condition.  He was placed in the supine position.  General endotracheal anesthesia was induced.  Foley catheter, arterial  line, and Swan-Ganz catheter in place.  The abdomen and both legs were  prepped and draped in a sterile fashion in the supine position.  A  longitudinal skin incision was made from xiphoid to pubis.  Dissection  was carried through the subcutaneous tissue with electrocautery.  Deep  dissection was carried  down to expose the linea alba.  The peritoneal  cavity was entered.  A full laparotomy evaluation was carried out.  The  liver, gallbladder, bile ducts, and pancreas were all normal.  The  stomach and duodenum were unremarkable.  The large bowel revealed no  masses.  There was a large infrarenal abdominal aortic aneurysm present.  This was consistent with 6 cm.  There was marked tortuosity of the neck.   The retroperitoneum was incised along the aneurysm sac proximally up to  the neck.  The inferior mesenteric vein was retracted superiorly.  The  neck was exposed up to the renal arteries.  Traction was placed on the  infrarenal aorta to straighten the neck to aid with dissection.  Distal  dissection was carried down along the aortic aneurysm sac.  The inferior  mesenteric artery was freed and encircled with a vessel loop.  Further  distal dissection carried down to the bifurcation where the common iliac  arteries bilaterally were encircled with vessel loops.   The patient was administered 25 grams of mannitol intravenously and 5000  units heparin intravenously.  The infrarenal  aorta was controlled with  an aortic DeBakey clamp.  The common iliac arteries bilaterally were  controlled with coarctation clamps.  A longitudinal incision was made  through the aneurysm sac.  A laminated thrombus removed.  Back bleeding  lumbar vessels were controlled with figure-of-eight 2-0 silk suture.  The aneurysm sac was then opened up to the neck and the neck was  divided.  I opened distally down to the aortic bifurcation where the  aorta was also divided.  An 18 mm straight Dacron graft was then chosen.  This was anastomosed end-to-end to the infrarenal aortic neck using  running 3-0 Prolene suture.  The proximal anastomosis was then tested  and bleeding sites were controlled with interrupted pledgeted 4-0  Prolene suture.  At completion of the proximal anastomosis, the proximal  clamp was removed and a  clamp placed across the graft.   The graft was brought down to the aortic bifurcation.  The common iliac  arteries bilaterally were flushed.  The graft was then divided and  anastomosed end-to-end to the aortic bifurcation using running 3-0  Prolene suture.  At completion, the graft was flushed and, once again,  the common iliac arteries bilaterally were then flushed.  The clamp was  removed and excellent flow was present into the legs.   Adequate hemostasis was obtained.  The patient was administered 50 mg  protamine intravenously.  Sponges and instrument counts were correct.  The aneurysm sac was closed over the graft using running 2-0 Vicryl  suture.  The retroperitoneum was reapproximated with running 3-0 Vicryl  suture.  The abdomen was examined to assure there were no retained  instruments or sponges.  The midline fascia was closed with running #1  PDS suture.  The subcutaneous tissues were loosely approximated with  interrupted 3-0 Vicryl suture.  The skin was closed with staples.  A  sterile dressing was applied.  The patient tolerated the procedure well.  There were no apparent complications.  He was transferred to the  recovery room in stable condition.      Balinda Quails, M.D.  Electronically Signed     PGH/MEDQ  D:  10/01/2006  T:  10/01/2006  Job:  [id number]

## 2011-03-08 NOTE — Discharge Summary (Signed)
NAME:  Walter [name], Walter [name]                ACCOUNT NO.:  st   [name]:  [id number]          PATIENT TYPE:  INP   LOCATION:  2001                         FACILITY:  [name]   PHYSICIAN:  Walter [name], P.A.DATE OF BIRTH:  11/26/1924   DATE OF ADMISSION:  DATE OF DISCHARGE:  10/23/2006                               DISCHARGE SUMMARY   ADMISSION DIAGNOSES:  1. Gastrointestinal bleed with melena.  2. Acute hypotension.  3. Anemia.   DISCHARGE/SECONDARY DIAGNOSES:  1. Acute gastric ulcer.  2. Acute duodenal ulcer.  3. Reflux esophagitis.  4. Acute blood loss anemia secondary to gastrointestinal bleed.  5. Acute gastrointestinal bleed secondary to duodenal and gastric      ulcer, also with history of non-steroidal anti-inflammatory use.  6. Positive H. pylori antibody.  7. Hypotension with shock, resolved.  8. History of hypothyroidism.  9. History of abdominal aortic aneurysm, status post repair on      October 01, 2006 by Dr. Denman [name].  10.Coronary artery disease status post percutaneous coronary      intervention in January 2006.  11.Dyslipidemia.  12.Hypertension.  13.Allergic rhinitis.  14.History of tonsillectomy and adenoidectomy in 1934.  15.History of right ear tube in 2004.  16.Intraoperative small bowel enterotomy status post closure.  17.Osteoarthritis of the right knee.  18.Postoperative hyperglycemia, improved.  (Hemoglobin A1c 5.9).   PROCEDURES:  1. August 17, 2006:  A esophagogastroduodenoscopy by Dr. Melvia [name] showing no active bleed.  2. October 18, 2006:  Laparotomy and closure of small bowel      enterotomy by Walter [name].  3. October 18, 2006:  Esophagogastroduodenoscopy by Dr. Claudette [name]      showing acute duodenal and gastric ulcerations without hemorrhage      and evidence of reflux esophagitis.   BRIEF HISTORY:  Walter [name] was a 77 year old white male who was  recently discharged from [name] on  October 18, 2006 after  repair of his 6-cm infrarenal abdominal aortic aneurysm by Dr. Denman [name].  On December 28, the patient was in his usual state of  health, recovering from his surgery, when he had one bowel movement  followed by two further bowel movements both which were black in color.  The initial stool was solid and then he started to pass more loose  stools thereafter.  The patient presented to the emergency department  and had a large loose black stool there which was heme positive.  On  exam he was pale, tachycardiac and hypotensive.  He was initially  admitted by the Walter [name] and a GI consult was ordered  for further evaluation of his GI bleed with melena.   HOSPITAL COURSE:  Walter [name] was seen in the emergency department for  GI bleed on October 17, 2006.  As mentioned, he was hypotensive and  anemic with a hemoglobin of 6.5 requiring a transfusion and IV fluids.  He had not been on Coumadin therapy but had been on Naprosyn as well as  Plavix and aspirin.  He underwent an EGD  which did not reveal any blood  in the stomach, first or second part of the duodenum.  Initially, a CT  scan was desired with the negative EGD but the patient became too  unstable and surgeon [name] was consulted and felt Dr. Amie [name]  should be taken emergently to the operating room to rule out possible  acute aortoenteric fistula.  After discussing plans with the patient and  family, he was taken emergently to the operating room and exploratory  laparotomy was performed.  Findings did show blood in the nasogastric  tube and a suspected gastric clot was palpated.  No evidence of an  aortoenteric fistula was noted.  Intraoperatively there was a small  bowel enterotomy which did require closure and a general surgeon, Dr.  Lindie [name], did assist intraoperatively.  Postoperatively, [name]  remained intubated and transferred to the surgical intensive care unit.  He did  require 6 units of packed red blood cells intraoperatively.  Dr.  Madilyn [name] discussed the intraoperative findings with Dr. Russella [name], the on call  gastroenterologist.  He recommended re-scoping the patient which was  done on October 18, 2006, this time showing evidence of dark clots and  old blood in the stomach.  Gastric and duodenal ulcers were noted as  well as evidence of reflux esophagitis.  By this point, his Naprosyn,  aspirin and Plavix had all been placed on hold.  An H. pylori test was  ordered which showed the antibody elevated at 1.1.  He was started on  Biaxin, Metronidazole and Protonix.  Postoperatively, [name] was  monitored closely in the intensive care unit.  He was extubated by  October 19, 2006 with a stable hemoglobin and hematocrit of 11 and 31,  respectively.  Over the next few days his diet was slowly advanced and  nasogastric tube discontinued.  By October 21, 2006, he had been  tolerating a full liquid diet and remained hemodynamically stable.  His  abdominal distention was improving and he was beginning to have bowel  movements.  It was felt appropriate to transfer him to telemetry unit  2000 where it is anticipated he will remain until discharge.  His diet  was subsequently advanced to a heart healthy diet.  He was able to  ambulate well and pain was controlled on oral medication.  His incisions  appeared to be healing well without signs of infection.  He remained  afebrile with stable vital signs although with mild hypertension with  systolic blood pressure around 140.  His Toprol was resumed.  He  maintained sinus rhythm in the 80s and oxygen saturations of 94% on room  air.  If [name] continues to tolerate a regular diet and labs  remain stable, it is anticipated he will be ready for discharge home on  October 23, 2006.   LABORATORY DATA:  Most recent labs as of October 22, 2006 show a C INR of 1.1, white blood count of 7.9, hemoglobin 9.4, hematocrit  27.6, platelet  count of 204, PTT of 26, triglycerides of 84, H. pylori antibodies IGG  elevated at 1.1, sodium 135, potassium 3.7, chloride 108, CO2 23, blood  glucose 131, BUN 32, creatinine 0.9, calcium 7.1, hemoglobin A1c of 5.9.  Negative urinalysis on October 18, 2006.  Triglycerides 117 and total  bilirubin of 1.3, alkaline phosphatase of 27, AST 18, ALT 19, total  protein 3.2, blood albumin 1.6.  Of note, he also was treated for  hypoglycemia postoperatively and treated with NovoLog  insulin sliding  scale q a.c. and h.s.  Moreover, this was continued at discharge as his  non-fasting sugars were between 100-150.   DISCHARGE MEDICATIONS:  1. Biaxin 250 mg p.o. b.i.d. times seven days.  2. Metronidazole 500 mg 1 tablet p.o. b.i.d. times seven days.  3. Protonix 40 mg 1 p.o. b.i.d.  4. Ultram 50 mg 1-2 tablets p.o. q 4-6 hours p.r.n. pain.  5. Synthroid 75 mcg p.o. daily.  6. Toprol XL 25 mg daily.  7. Vytorin 10/40 mg p.o. daily.  8. He is instructed to hold his Plavix and aspirin for at least two      weeks until cleared by Dr. Russella [name] at his followup appointment.  9. He was also instructed to stop his Naprosyn and avoid other NSAIDS.   DISCHARGE INSTRUCTIONS:  He is to follow a low salt, low fat diet and he  may shower and clean his incisions gently with soap and water.  He  should notify the [facility] office if he develops fever greater than 101 or  redness or drainage from any of his incision sites or persistent nausea  or vomiting.  He should increase his activity slowly and was encouraged  to continue daily walking and breathing exercises.  He should avoid  driving or heavy lifting for the next three weeks.   FOLLOWUP:  He is to followup with [name] at the [facility] office on  November 06, 2006 at 10:00 a.m. and has a staple removal appointment at  [facility] office on October 28, 2006 at 9:00 a.m.  He is to call Dr. Ardell Isaacs  office to schedule an EGD for late February or early  March.      Walter [name], P.A.     AWZ/MEDQ  D:  10/22/2006  T:  10/22/2006  Job:  [id number]   cc:   Melissa L. Ladona Ridgel, MD  Corky Crafts, MD  Venita Lick. Walter Dar, MD, Yves Dill, M.D.

## 2011-03-08 NOTE — Discharge Summary (Signed)
NAMESTEPHANIE, [name]            ACCOUNT NO.:  [id number]   [name]:  [id number]          PATIENT TYPE:  INP   LOCATION:  2037                         FACILITY:  [facility]   PHYSICIAN:  Balinda Quails, M.D.    DATE OF BIRTH:  [birth year]   DATE OF ADMISSION:  10/01/2006  DATE OF DISCHARGE:                               DISCHARGE SUMMARY   The patient underwent stress test by Dr. Eldridge Dace which was seen to be  stable.  Walter [name] underwent CT scan arteriography which revealed quit marked  tortuosity of the proximal neck which precluded placement of the  orthostatic graft.  Dr. Madilyn Fireman discussed with the patient undergoing open  repair.  [name] discussed risks and benefits with the patient.  The patient  acknowledged understanding and agreed to proceed.  Surgery was scheduled  for October 01, 2006.  For details of the patient's past medical  history and physical exam, please see dictated History and Physical.   The patient was taken to the operating room October 01, 2006, where [name]  underwent repair of 6 cm infrarenal abdominal aortic aneurysm with  straight Dacron aortic graft.  The patient tolerated this procedure well  and was transferred to the intensive care unit in stable condition.  Following surgery as the patient was waking up, [name] was noted to be alert  or x3.  Neurologically intact.  The patient's postoperative course was  pretty much unremarkable.  [name] remained hemodynamically stable.  [name] was  out of bed into chair postop day #1 and was out of bed ambulating postop  day #2.  Vital signs were stable.  [name] was able to be weaned off oxygen,  saturating greater than 90% on room air.  [name] was noted to be afebrile  postoperatively.  The patient was transferred to 2000 October 06, 2006.  The patient's GI was slow to recover.  Bowel sounds returned by December  15.  [name] was started on sips of clears.  The patient tolerated that well.  His diet was advanced December 17.  By December 18, the  patient was  tolerating regular diet well, no nausea or vomiting.  Also noted her to  be positive for bowel movements and flatus.   Postoperatively, the patient remained in normal sinus rhythm.  Lungs  were clear to auscultation bilaterally.  His incisions were clean, dry  and intact and healing well.  The patient's blood sugars were monitored  postoperatively and seen to be stable.  [name] has no history of diabetes  mellitus.  Hemoglobin A1c was ordered for a.m. October 08, 2006.  Physical therapy was consulted postoperatively and recommended the  patient be discharged home with home health PT.   Last labs obtained December 17, showed a white count 7.8, hemoglobin  8.7, hematocrit 25.2, platelet count of 184.  Sodium of 137, potassium  2.88, chloride of 102, bicarb of 29, BUN of 8, creatinine 0.9, glucose  to 145.  The patient has orders for a repeat CBC and BMP prior to  discharge home.  This will be to follow  patient's hemoglobin/hematocrit  and his potassium.  The patient is tentatively ready for discharge home in the next 1-2  days.   FOLLOWUP APPOINTMENTS:  A followup appointment will be arranged with Dr.  Madilyn Fireman for in 3 weeks.  Our office will contact the patient with this  information.  Office will also inform patient of appointment to follow  up with nurse for staple removal.   ACTIVITY:  The patient was instructed no driving until released to do  so, no heavy lifting over 10 pounds.  [name] is told to ambulate 3-4 times  per day and progress as tolerated.  Continue breathing exercises.   INCISIONAL CARE:  The patient is told to shower, washing his incisions  using soap and water.  [name] is to contact the office if [name] develops any  drainage or opening from any of his incision sites.   DIET:  The patient was educated on diet to be low-fat, low-salt.   DISCHARGE MEDICATIONS:  1. Ultram 50 mg 1-2 tablets q. 4-6 h p.r.n. pain.  2. Synthroid 75 mcg daily.  3. Toprol XL 25 mg  daily.  4. Naproxen 500 mg 3 times a day p.r.n..  5. Vytorin 10/40 daily.  6. Plavix 75 mg daily.  7. Aspirin 81 mg daily.      Walter Acre Dominick, PA      P. Liliane Bade, M.D.  Electronically Signed    KMD/MEDQ  D:  10/07/2006  T:  10/07/2006  Job:  [id number]

## 2011-03-08 NOTE — H&P (Signed)
[name], Walter [name]            ACCOUNT NO.:  [id number]   [name]:  [id number]           PATIENT TYPE:   LOCATION:                                 FACILITY:   PHYSICIAN:  Balinda Quails, M.D.    DATE OF BIRTH:  Jan 21, 1925   DATE OF ADMISSION:  10/01/2006  DATE OF DISCHARGE:                              HISTORY & PHYSICAL   CARDIOLOGIST:  Dr. Eldridge Dace   DATE OF ADMISSION:  October 01, 2006   CHIEF COMPLAINT:  Abdominal aortic aneurysm, asymptomatic.   HISTORY AND PHYSICAL:  This is a 77 year old Caucasian male with past  medical history of coronary artery disease, hypertension and  hyperlipidemia presenting with an abdominal aortic aneurysm.  Patient  was referred to Dr. Madilyn Fireman in November 2007 after discovery of his  infrarenal abdominal aortic aneurysm.  The patient had a routine  ultrasound at his church when an abdominal aorta was seen and he was  referred to the emergency room.  The patient subsequently followed up in  the emergency room and had a CT scan on August 06, 2006, which revealed  a fusiform infrarenal abdominal aortic aneurysm which tapered at the  level of the common ileac arteries.  This is a 6 cm aneurysm.  Common  ileac arteries measured 14 mm bilaterally.  Patient then followed up  with Dr. Madilyn Fireman on August 21, 2006.  Patient was scheduled to undergo a  diagnostic arteriogram for further evaluation of the aneurysm and  consideration of graft placement.  Patient did undergo arteriography by  Dr. Madilyn Fireman, however the results are unavailable at this time, they were  reviewed by Dr. Madilyn Fireman and will be reviewed again prior to surgery.   The patient has remained asymptomatic and presents today without any  symptoms.  He denies any abdominal pain, nausea, vomiting, constipation,  hematochezia, hematemesis, back pain, claudication symptoms, peripheral  edema, dysuria, hematuria, reflux symptoms, angina, palpitations,  history of arrhythmias and TIAs or  CVA symptoms.  Prior to AAA repair,  he did have a preop evaluation by his cardiologist, Dr. [name],  who has cleared him for surgery.   PAST MEDICAL HISTORY:  1. Coronary artery disease status post PCI in January of 2006.  2. Dyslipidemia.  3. Hypertension.  4. Allergic rhinitis.   PAST SURGICAL HISTORY:  1. PCI to the LAD in January of 2006.  2. Tonsil and adenoidectomy in 1934.  3. Tubes placed in right ear in 2004.   ALLERGIES:  THE PATIENT IS ALLERGIC TO PENICILLIN, REACTION IS EDEMA.   MEDICATIONS:  1. Aspirin 325 mg p.o. daily.  2. Plavix 75 mg p.o. daily, this was stopped on September 24, 2006.  3. Synthroid 75 mcg p.o. daily.  4. Vytorin 10/40 mg p.o. daily.  5. Toprol XL 25 mg p.o. daily.   REVIEW OF SYSTEMS:  See HPI for pertinent positives and negatives,  otherwise negative for COPD, diabetes mellitus, syncope, myocardial  infarction and cerebrovascular accident.  Patient has had a history of  arthritis.   SOCIAL HISTORY:  Patient is widowed and lives alone.  He currently is  being seen and his niece is with him.  Patient is a former tobacco user,  however quit five years ago.  He denies any alcohol use.  Patient is  retired from [employer].  He does still drive.   FAMILY HISTORY:  Patient's mother deceased at 50 due to end-stage renal  disease.  His father deceased at 2 due to a cerebrovascular accident.  His paternal grandmother had a history of diabetes mellitus.   PHYSICAL EXAMINATION:  VITALS:  Blood pressure 145/60, heart rate 72,  respirations 16.  GENERAL:  This is a 77 year old Caucasian male in no acute distress.  HEENT:  Normocephalic, atraumatic.  Pupils equal, round and reactive to  light and accommodation.  Extraocular movements intact.  Oral mucosa  pink and moist.  Sclerae nonicteric.  Dentures complete in the upper.  NECK:  Neck is supple.  Palpable carotids.  No carotid bruits heard to  auscultation.  RESPIRATORY:  Symmetrical  inspiration, unlabored and clear to  auscultation bilaterally.  CARDIAC:  Regular rate and rhythm.  No murmur, gallop, rub.  ABDOMEN:  Soft, nontender, nondistended. Normoactive bowel sounds x4.  Patient's stomach is slightly obese.  No organomegaly felt.  GU/RECTAL:  Deferred.  EXTREMITIES:  Positive nonpitting edema bilaterally, mild.  Temperature  of bilateral extremities warm.  Patient has 2+ radial, femoral,  popliteal, dorsalis pedis and posterior tibial pulses bilaterally.  NEUROLOGIC:  Nonfocal.  Alert and oriented x4.  Gait steady.  Muscle  strength 5+ bilaterally throughout.  Deep tendon reflexes 2+ and  symmetrical.   ASSESSMENT:  Abdominal aortic aneurysm, 6 cm infrarenal, asymptomatic.   PLAN:  1. Admit the patient to [facility] on October 01, 2006 by      Dr. Liliane Bade.  2. The patient will undergo graft and repair of his abdominal aortic      aneurysm.  3. The risks and benefits were explained to the patient in great      detail, Dr. Madilyn Fireman has seen and evaluated patient prior to admission      and agrees with the above.      Constance Holster, PA      P. Liliane Bade, M.D.  Electronically Signed    JMW/MEDQ  D:  09/29/2006  T:  09/30/2006  Job:  [id number]   cc:   Donia Guiles, M.D.  Corky Crafts, MD

## 2011-03-08 NOTE — Cardiovascular Report (Signed)
NAMEHILLIS, Walter [name] NO.:  [id number]   [name]:  [id number]          PATIENT TYPE:  INP   LOCATION:  1828                         FACILITY:  [facility]   PHYSICIAN:  Lyn Records III, M.D.DATE OF BIRTH:  1924-11-27   DATE OF PROCEDURE:  10/26/2004  DATE OF DISCHARGE:                              CARDIAC CATHETERIZATION   INDICATIONS:  Abnormal Cardiolite demonstrating large region of anterior  ischemia and documented high-grade focal proximal LAD.   PROCEDURE PERFORMED:  1.  Balloon angioplasty and drug-eluting stent implantation in the proximal      LAD.  2.  Angioseal arteriotomy closure.   DESCRIPTION:  After informed consent in the catheterization lab,  percutaneous coronary intervention on the LAD was decided to be the  treatment of choice. I discussed with the patient the method of PCI and  stent implantation and the risks. The benefits of the procedure were  discussed. The risks including death, stroke, myocardial infarction,  emergency bypass surgery, renal insufficiency, arterial insufficiency, and  bleeding were discussed in detail and accepted by the patient. I attempted  to discuss this with the patient's niece, but she was not available and had  gone to get something to eat away from the visiting area. After feeling  comfortable that the patient understood the procedure and was consenting to  have the procedure, we proceeded.   We used the existing 6-French arterial sheath placed by Dr. Fraser Din and then  performed PCI after the patient was loaded with intravenous heparin of 5200  units, and a double bolus followed by an infusion of Integrilin, and 300 mg  orally of Plavix. Also received 320 mg of baby aspirin.   A BMW wire was then used across the stenosis in the LAD. Predilatation  was  performed with a 2.5 x 12 mm long Maverick, and then a 13 x 3.0 mm Cypher  stent was deployed to 14 atmospheres.  Three balloon inflations were  performed. No pulse dilatation was performed because of a good angiographic  result. After to aliquots of intracoronary nitroglycerin, 200 mcg each,  final angiograms were performed. Angioseal arteriotomy closure was performed  after demonstrating adequate vascular access. Initially the Angioseal was  successful; however, before could get the patient into the holding area, it  was noted that he had a small to moderate size hematoma. We then held  pressure until hemostasis was achieved, and the patient continued to be  monitored over the next several hours.   ANGIOGRAPHIC AND PERCUTANEOUS INTERVENTION RESULTS:  Proximal LAD contained  a somewhat eccentric 95% stenosis in the proximal vessel. Postprocedure, 0%  stenosis was noted. The mid part of the LAD beyond the first diagonal  contained a 50% narrowing, but we did not feel this was significant enough  to require stenting. TIMI-3 flow was noted post.   COMPLICATIONS:  Small groin hematoma following Angioseal arteriotomy closure  that was initially successful, but then accompanied by oozing.   ASSESSMENT:  1.  Successful percutaneous intervention of the left anterior descending      with reduction in stenosis from 95% was 0%.  2.  Failed Angioseal arteriotomy closure with persistent leaking resulting      in small to moderate size hematoma.   PLAN:  1.  Groin hole:  May need to use prolonged compression.  2.  Would continue Integrilin for the time being.  3.  Hold for discharge October 27, 2004.       HWS/MEDQ  D:  10/26/2004  T:  10/26/2004  Job:  [id number]   cc:   Donia Guiles, M.D.  [address] [place]  [place] [address]  Fax: [phone]   Meade Maw, M.D.  [address]  [place]  [place] [address]  Fax: [phone]

## 2011-03-08 NOTE — Discharge Summary (Signed)
Walter [name], Walter [name]            ACCOUNT NO.:  [id number]   [name]:  [id number]          PATIENT TYPE:  INP   LOCATION:  2037                         FACILITY:  [facility]   PHYSICIAN:  Balinda Quails, M.D.    DATE OF BIRTH:  June 01, 1925   DATE OF ADMISSION:  10/01/2006  DATE OF DISCHARGE:  10/08/2006                               DISCHARGE SUMMARY   PRINCIPAL DIAGNOSES:  A 6-cm infrarenal abdominal aortic aneurysm.   SECONDARY DIAGNOSES:  1. Underlying coronary artery disease status post PTI in January 2006.  2. Dyslipidemia.  3. Hypertension.  4. Allergic rhinitis.  5. Osteoarthritis, right knee.  6. Status post tonsillectomy and adenoidectomy.  7. Status post tubes placed right ear in 2004.   IN HOSPITAL OPERATIONS AND PROCEDURES:  Repair 6-cm infrarenal abdominal  aortic aneurysm with straight Dacron aortic graft.   HISTORY AND PHYSICAL AND HOSPITAL COURSE:  [name] is a 77-year-  old male referred for evaluation of a large abdominal aortic aneurysms.  The patient had a routine ultrasound in [place] and the abdominal aorta  was seen, and he was referred to the emergency room.  The patient was  then seen and evaluated by Dr. Madilyn Fireman in the office.  Subsequently, he  underwent a stress test   Dictation ended at this point.      Stephanie Acre Dominick, PA      P. Liliane Bade, M.D.  Electronically Signed    KMD/MEDQ  D:  10/07/2006  T:  10/08/2006  Job:  [id number]

## 2011-03-08 NOTE — H&P (Signed)
Walter [name], Walter [name]            ACCOUNT NO.:  [id number]   [name]:  [id number]          PATIENT TYPE:  INP   LOCATION:  2305                         FACILITY:  [facility]   PHYSICIAN:  Melissa L. Ladona Ridgel, MD  DATE OF BIRTH:  11/30/1924   DATE OF ADMISSION:  10/17/2006  DATE OF DISCHARGE:                              HISTORY & PHYSICAL   CHIEF COMPLAINT:  Active GI bleeding with some black stools and near  syncope.   PRIMARY CARE PHYSICIAN:  Primary care physician is Dr. Donia Guiles.   HISTORY OF PRESENT ILLNESS:  The patient is a 77 year old white male,  who recently was discharged on 10/08/2006, after repair of a 6 cm  infrarenal aortic abdominal aortic aneurysm.  Earlier today the patient  was in his usual state of health, recovering from his surgery, when he  had 1 bowel movement followed by 2 further bowel movements, both were  black in color.  The initial stool was solid, and then he started to  pass more loose stools thereafter.  The patient had a large, loose black  stool in the emergency room, which was heme positive.  The patient is  obviously pale, tachycardic, and hypotensive, which started in the  emergency room.  The patient was presented to GI, who stated that they  would consult.  He also was presented to cardiovascular and thoracic and  surgery, who deferred to GI for further workup.   REVIEW OF SYSTEMS:  He has denied fever.  His appetite has been okay.  His last meal was about 4:00 p.m.  He did have a near syncopal/syncopal  episode at home while going to the bathroom, but he was seated quickly  by his niece and had no further episodes in the emergency room.   PAST MEDICAL HISTORY:  For coronary artery disease with percutaneous  intervention to the LAD in January of 2006.  He recently had a stress  test prior to his cardiovascular surgery under the care of Dr. Eldridge Dace,  and he was cleared for pre-op at that time.  He does have dyslipidemia,  hypertension, osteoarthritis of the right knee.  His discharging  hemoglobin from the vascular surgery team was 8.7, with creatinine of  0.9 and a BUN of 8.   PAST SURGICAL HISTORY:  Tonsils, adenoids, myringotomy, and a AAA  repair.   SOCIAL HISTORY:  He quit tobacco some time ago.  He does not drink.  He  is retired from [employer].  He is a widower.   FAMILY HISTORY:  Mom had cancer of the kidney.  Dad had strokes.  Both  are deceased.   ALLERGIES:  Are to penicillin, which caused edema, and morphine caused  craziness.   CURRENT MEDICATIONS:  1. Ultram 50 mg 1 to 2 tablets q4 to 6 hours p.r.n.  2. Synthroid 75 micrograms daily.  3. Toprol-XL 25 mg q day.  4. Naproxen 500 mg t.i.d.  5. Vytorin 10/40 per os q daily.  6. Plavix 75 mg daily.  7. Aspirin 81 mg daily.   PHYSICAL EXAMINATION:  Vital signs:  Temperature is not available.  His  blood pressure was 140/13 on arrival but quickly deteriorated to 81/43  and then systolically over 60.  With intravenous fluid hydration, his  blood pressure is back to 120/92, heart rate of 99 to 109.  General:  He  is in no acute distress.  He is very pale.  He is normocephalic,  atraumatic.  Pupils are equal, round, reactive to light.  Extraocular  muscles are intact.  Mucous membranes are moist.  Neck is supple.  There  was no JVD, no lymph nodes, and no carotid bruits.  His chest is  decreased but clear.  Cardiovascular is regular rate and rhythm,  positive S1, S2, no S3, S4.  No murmurs, rubs, or gallops.  Abdomen is  soft, nontender, nondistended with positive bowel sounds.  There is a  positive central abdominal scar with minimal erythema.  Staples appear  to be intact.  Neurologically:  He is alert, awake, and oriented times  3.  Cranial nerves II-XII are intact.  Power is 5/5.   LABORATORY DATA:  Sodium is 134, potassium 5, chloride 102, CO2 is 23,  BUN is 60, creatinine is 1.1, glucose is 171, white count 13, hemoglobin  6.7,  hematocrit 19.7, platelets of 500, and he has a neutrophilic  percentage of 84%.   PTT was 24, PT/INR was 15.1 and 1.2, respectively.  Albumin was 2.5.  Calcium was 8.   ASSESSMENT/PLAN:  This is a 77 year old white male with recent AAA  repair, admitted with black stools and acute decrease in blood pressure,  as well as anemia at 6.7.  1. GI melena with acute hypotension and anemia. Differential diagnosis      is upper gastrointestinal bleed, secondary to Naprosyn, aspirin,      and Plavix versus intraaortic fistula status post AAA repair.   PLAN:  1. He is undergoing a stat EGD, and we will consider a CT scan of the      abdomen if this is negative.  We will hold his aspirin and Plavix,      and we will transfuse to at least a hemoglobin of 8.5.  2. GU.  With the increased WBC count but no fever in the face of      positive neutrophilia, I will go ahead and check a chest x-ray and      a UA culture and sensitivity.  3. Cardiovascular.  Sinus tachycardia, secondary to GI bleeding and      volume loss.  We will go ahead and volume replete with packed red      cells and hold his Toprol for now.  4. Pulmonary.  His lungs are clearly diminished.  We will add      incentive spirometry and watch for a volume overload, and as      stated, we will check a chest x-ray.  5. Endocrine.  We will continue his Synthroid intravenously at half      the oral dose.  Increase blood sugars.  I would like to check the      hemoglobin A1c and may need to consider finger sticks, depending on      his status post evaluation.  6. DVT prophylaxis.  Will be treated with SCDs for now.      Melissa L. Ladona Ridgel, MD  Electronically Signed     MLT/MEDQ  D:  10/18/2006  T:  10/18/2006  Job:  [id number]   cc:   Balinda Quails, M.D.  Barbette Hair. Arlyce Dice, MD,FACG  Donia Guiles, M.D.

## 2011-03-08 NOTE — H&P (Signed)
NAMEDEMOND, Walter [name] NO.:  [id number]   [name]:  [id number]          PATIENT TYPE:  INP   LOCATION:  2305                         FACILITY:  [facility]   PHYSICIAN:  Barbette Hair. Arlyce Dice, MD,FACGDATE OF BIRTH:  03-15-1925   DATE OF ADMISSION:  10/17/2006  DATE OF DISCHARGE:                              HISTORY & PHYSICAL   PROBLEM:  Melena.   HISTORY OF PRESENT ILLNESS:  [name] is a pleasant 77 year old  white male admitted with melena.  Approximately three weeks ago, he  underwent surgery for an abdominal aortic aneurysm.  Clinical course  since discharge has been unremarkable.  He has been taking Plavix and  Naprosyn on a regular basis.  Today, he has several melenic stools.  He  was seen in the emergency room where he was noted to be orthostatic and  hypotensive.  He has no history of ulcer disease.  He denies pyrosis or  abdominal pain or change in bowel habits.  He has had a colonoscopy in  the past.  The results are not known.  At discharge, his hemoglobin was  approximately 5.   PAST MEDICAL HISTORY:  Pertinent for coronary artery disease.  He has a  coronary artery stent.  He has hypothyroidism and hypertension.   FAMILY HISTORY:  Noncontributory.   PHYSICAL EXAMINATION:  Pulse 109, blood pressure 81/43 initially.  He is pale but anicteric.  HEENT:  Within normal limits.  CHEST:  Clear.  There is a 1 to 2/6 holosystolic murmur at the left  sternal border.  Bowel sounds are active.  Abdomen is without masses,  tenderness, or organomegaly.  He has surgical staples in place.  There is no cyanosis, clubbing, or edema of his extremities.  Rectal is deferred.   Hemoglobin is 6.7, hematocrit 19.7, white count 13, platelet count 500.  Electrolytes are within normal limits.  BUN of 60, creatinine 1.1.   IMPRESSION:  Acute upper gastrointestinal bleed.  This is very likely  related to nonsteroidal anti-inflammatory drugs ulcer.  Of lesser  concern is an aorto-enteric fistula.  This is less likely in view of the  time course.   RECOMMENDATIONS:  1. Transfuse to hemoglobin 8 to 9.  2. IV Protonix.  3. Upper endoscopy.     Barbette Hair. Arlyce Dice, MD,FACG  Electronically Signed    RDK/MEDQ  D:  10/17/2006  T:  10/18/2006  Job:  [id number]

## 2013-06-17 ENCOUNTER — Telehealth: Payer: Self-pay | Admitting: Family Medicine

## 2013-06-17 NOTE — Telephone Encounter (Signed)
APPT MADE

## 2013-06-18 ENCOUNTER — Ambulatory Visit (INDEPENDENT_AMBULATORY_CARE_PROVIDER_SITE_OTHER): Payer: Medicare Other | Admitting: Family Medicine

## 2013-06-18 ENCOUNTER — Encounter: Payer: Self-pay | Admitting: Family Medicine

## 2013-06-18 VITALS — BP 139/85 | HR 90 | Temp 97.3°F | Ht 71.0 in | Wt 185.0 lb

## 2013-06-18 DIAGNOSIS — I639 Cerebral infarction, unspecified: Secondary | ICD-10-CM

## 2013-06-18 DIAGNOSIS — R5381 Other malaise: Secondary | ICD-10-CM

## 2013-06-18 DIAGNOSIS — I6350 Cerebral infarction due to unspecified occlusion or stenosis of unspecified cerebral artery: Secondary | ICD-10-CM

## 2013-06-18 DIAGNOSIS — F0390 Unspecified dementia without behavioral disturbance: Secondary | ICD-10-CM

## 2013-06-18 LAB — POCT CBC
Granulocyte percent: 69.8 %G (ref 37–80)
HCT, POC: 38.9 % — AB (ref 43.5–53.7)
Hemoglobin: 13.3 g/dL — AB (ref 14.1–18.1)
Lymph, poc: 1.8 (ref 0.6–3.4)
MCH, POC: 29.9 pg (ref 27–31.2)
MCHC: 34.2 g/dL (ref 31.8–35.4)
MCV: 87.4 fL (ref 80–97)
MPV: 6.6 fL (ref 0–99.8)
POC Granulocyte: 5.4 (ref 2–6.9)
POC LYMPH PERCENT: 23.3 %L (ref 10–50)
Platelet Count, POC: 183 10*3/uL (ref 142–424)
RBC: 4.5 M/uL — AB (ref 4.69–6.13)
RDW, POC: 15.3 %
WBC: 7.7 10*3/uL (ref 4.6–10.2)

## 2013-06-18 NOTE — Patient Instructions (Addendum)
Ischemic Stroke A stroke (cerebrovascular accident) is the sudden death of brain tissue. It is a medical emergency. A stroke can cause permanent loss of brain function. This can cause problems with different parts of your body. A transient ischemic attack (TIA) is different because it does not cause permanent damage. A TIA is a short-lived problem of poor blood flow affecting a part of the brain. A TIA is also a serious problem because having a TIA greatly increases the chances of having a stroke. When symptoms first develop, you cannot know if the problem might be a stroke or TIA. CAUSES  A stroke is caused by a decrease of oxygen supply to an area of your brain. It is usually the result of a small blood clot or collection of cholesterol or fat (plaque) that blocks blood flow in the brain. A stroke can also be caused by blocked or damaged carotid arteries.  RISK FACTORS  High blood pressure (hypertension).  High cholesterol.  Diabetes mellitus.  Heart disease.  The build up of plaque in the blood vessels (peripheral artery disease or atherosclerosis).  The build up of plaque in the blood vessels providing blood and oxygen to the brain (carotid artery stenosis).  An abnormal heart rhythm (atrial fibrillation).  Obesity.  Smoking.  Taking oral contraceptives (especially in combination with smoking).  Physical inactivity.  A diet high in fats, salt (sodium), and calories.  Alcohol use.  Use of illegal drugs (especially cocaine and methamphetamine).  Being male.  Being African American.  Being over the age of 55.  Family history of stroke.  Previous history of blood clots, stroke, TIA, or heart attack.  Sickle cell disease. SYMPTOMS  These symptoms usually develop suddenly, or may be newly present upon awakening from sleep:  Sudden weakness or numbness of the face, arm, or leg, especially on one side of the body.  Sudden trouble walking or difficulty moving arms or  legs.  Sudden confusion.  Sudden personality changes.  Trouble speaking (aphasia) or understanding.  Difficulty swallowing.  Sudden trouble seeing in one or both eyes.  Double vision.  Dizziness.  Loss of balance or coordination.  Sudden severe headache with no known cause.  Trouble reading or writing. DIAGNOSIS  Your caregiver can often determine the presence or absence of a stroke based on your symptoms, history, and physical exam. Computed tomography (CT) of the brain is usually performed to confirm the stroke, determine causes, and determine stroke severity. Other tests may be done to find the cause of the stroke. These tests may include:  Electrocardiography.  Continuous heart monitoring.  Echocardiography.  Carotid ultrasonography.  Magnetic resonance imaging (MRI).  A scan of the brain circulation.  Blood tests. PREVENTION  The risk of a stroke can be decreased by appropriately treating high blood pressure, high cholesterol, diabetes, heart disease, and obesity and by quitting smoking, limiting alcohol, and staying physically active. TREATMENT  Time is of the essence. It is important to seek treatment within 3 4 hours of the start of symptoms because you may receive a medicine to dissolve the clot (thrombolytic) that cannot be given after that time. Even if you do not know when your symptoms began, get treatment as soon as possible. After the 4 hour window has passed, treatment may include rest, oxygen, intravenous (IV) fluids, and medicines to thin the blood (anticoagulants). Treatment of stroke depends on the duration, severity, and cause of your symptoms. Medicines and diet may be used to address diabetes, high blood pressure,   and other risk factors. Physical, speech, and occupational therapists will assess you and work to improve any functions impaired by the stroke. Measures will be taken to prevent short-term and long-term complications, including infection from  breathing foreign material into the lungs (aspiration pneumonia), blood clots in the legs, bedsores, and falls. Rarely, surgery may be needed to remove large blood clots or to open up blocked arteries. HOME CARE INSTRUCTIONS   Take all medicines prescribed by your caregiver. Follow the directions carefully. Medicines may be used to control risk factors for a stroke. Be sure you understand all your medicine instructions.  You may be told to take aspirin or the anticoagulant warfarin. Warfarin needs to be taken exactly as instructed.  Too much and too little warfarin are both dangerous. Too much warfarin increases the risk of bleeding. Too little warfarin continues to allow the risk for blood clots. While taking warfarin, you will need to have regular blood tests to measure your blood clotting time. These blood tests usually include both the PT and INR tests. The PT and INR results allow your caregiver to adjust your dose of warfarin. The dose can change for many reasons. It is critically important that you take warfarin exactly as prescribed, and that you have your PT and INR levels drawn exactly as directed.  Many foods, especially foods high in vitamin K can interfere with warfarin and affect the PT and INR results. Foods high in vitamin K include spinach, kale, broccoli, cabbage, collard and turnip greens, brussels sprouts, peas, cauliflower, seaweed, and parsley as well as beef and pork liver, green tea, and soybean oil. You should eat a consistent amount of foods high in vitamin K. Avoid major changes in your diet, or notify your caregiver before changing your diet. Arrange a visit with a dietitian to answer your questions.  Many medicines can interfere with warfarin and affect the PT and INR results. You must tell your caregiver about any and all medicines you take, this includes all vitamins and supplements. Be especially cautious with aspirin and anti-inflammatory medicines. Do not take or  discontinue any prescribed or over-the-counter medicine except on the advice of your caregiver or pharmacist.  Warfarin can have side effects, such as excessive bruising or bleeding. You will need to hold pressure over cuts for longer than usual. Your caregiver or pharmacist will discuss other potential side effects.  Avoid sports or activities that may cause injury or bleeding.  Be mindful when shaving, flossing your teeth, or handling sharp objects.  Alcohol can change the body's ability to handle warfarin. It is best to avoid alcoholic drinks or consume only very small amounts while taking warfarin. Notify your caregiver if you change your alcohol intake.  Notify your dentist or other caregivers before procedures.  If swallow studies have determined that your swallowing reflex is present, you should eat healthy foods. A diet that includes 5 or more servings of fruits and vegetables a day may reduce the risk of stroke. Foods may need to be a special consistency (soft or pureed), or small bites may need to be taken in order to avoid aspirating or choking. Certain diets may be prescribed to address high blood pressure, high cholesterol, diabetes, or obesity.  A low-sodium, low-saturated fat, low-trans fat, low-cholesterol diet is recommended to manage high blood pressure.  A low-saturated fat, low-trans fat, low-cholesterol, and high-fiber diet may control cholesterol levels.  A controlled-carbohydrate, controlled-sugar diet is recommended to manage diabetes.  A reduced-calorie, low-sodium, low-saturated fat, low-trans   fat, low-cholesterol diet is recommended to manage obesity.  Maintain a healthy weight.  Stay physically active. It is recommended that you get at least 30 minutes of activity on most or all days.  Do not smoke.  Limit alcohol use even if you are not taking warfarin. Moderate alcohol use is considered to be:  No more than 2 drinks each day for men.  No more than 1 drink  each day for nonpregnant women.  Stop drug abuse.  Home safety. A safe home environment is important to reduce the risk of falls. Your caregiver may arrange for specialists to evaluate your home. Having grab bars in the bedroom and bathroom is often important. Your caregiver may arrange for equipment to be used at home, such as raised toilets and a seat for the shower.  Physical, occupational, and speech therapy. Ongoing therapy may be needed to maximize your recovery after a stroke. If you have been advised to use a walker or a cane, use it at all times. Be sure to keep your therapy appointments.  Follow all instructions for follow-up with your caregiver. This is very important. This includes any referrals, physical therapy, rehabilitation, and lab tests. Proper follow up can prevent another stroke from occurring. SEEK MEDICAL CARE IF:  You have personality changes.  You have difficulty swallowing.  You are seeing double.  You have dizziness.  You have a fever.  You have skin breakdown. SEEK IMMEDIATE MEDICAL CARE IF:  Any of these symptoms may represent a serious problem that is an emergency. Do not wait to see if the symptoms will go away. Get medical help right away. Call your local emergency services (911 in [place] Do not drive yourself to the hospital.  You have sudden weakness or numbness of the face, arm, or leg, especially on one side of the body.  You have sudden trouble walking or difficulty moving arms or legs.  You have sudden confusion.  You have trouble speaking (aphasia) or understanding.  You have sudden trouble seeing in one or both eyes.  You have a loss of balance or coordination.  You have a sudden, severe headache with no known cause.  You have new chest pain or an irregular heartbeat.  You have a partial or total loss of consciousness.   Document Released: 10/07/2005 Document Revised: 09/23/2012 Document Reviewed: 05/17/2012 [employer] Patient  Information 2014 [employer].  

## 2013-06-18 NOTE — Progress Notes (Signed)
  Subjective:    Patient ID: Walter [name], male    DOB: July 02, 1925, 79 y.o.   MRN: [id number]  HPI  This 79 y.o. male presents for evaluation of establishment.  He has  Hx of CVA and has some expressive and receptive aphasia.  He is accompanied By his POA his nephew who is a blood relative.  He has his deceased wife's family here living in hte town who help.  Phebe Colla is POA, and nephew, he wants him to get Home Health started to assist him.  He is able to take care of himself well and his nephew states that    He is living alone and is doing [place] but may need help with some medicines and some ADL's.  He states he has minimal Dementia, if any, and that his speech problem, which occurred after his cva, gives the impression he Is demented.  Nephew states he is a safe driver.  Nephew mentions to patient that if the Living arrangement doesn't work out here he will have to come up to [place] with him.   Review of Systems No chest pain, SOB, HA, dizziness, vision change, N/V, diarrhea, constipation, dysuria, urinary urgency or frequency, myalgias, arthralgias or rash.     Objective:   Physical Exam Vital signs noted  Elderly male in NAD.  HEENT - Head atraumatic Normocephalic                Eyes - PERRLA, Conjuctiva - clear Sclera- Clear EOMI                Ears - EAC's Wnl TM's Wnl Gross Hearing WNL                Nose - Nares patent                 Throat - oropharanx wnl Respiratory - Lungs CTA bilateral Cardiac - RRR S1 and S2 without murmur GI - Abdomen soft Nontender and bowel sounds active x 4 Extremities - No edema. Neuro - Grossly intact.    Assessment & Plan:  CVA (cerebral infarction) - Plan: Face-to-face encounter (required for Medicare/Medicaid patients), CMP14+.  He does have some expressive aphasia as a residual.    Dementia - Plan: Face-to-face encounter (required for Medicare/Medicaid patients), CMP14+EGFR, TSH.  Other malaise and fatigue - Plan: POCT CBC,  CMP14+EGFR, TSH  Will have home health go out and see patient.  Discussed with patient and POA that he probably shouldn't drive and POA assures me that he is safe and that although his speech is not the best  He doesn't make bad decisions and that he is a safe driver and is only driving a safe distance during The daytime.  He (POA) assures me that if anything changes he will have his uncle come up to [place] And be placed in Assisted Living or nursing home.  The Nephew [employer]) states he believes his uncle (patient) is able to live independently and he wants to keep him as independent as he can be with the Help of home health.    Follow up in one month.

## 2013-06-19 LAB — CMP14+EGFR
ALT: 14 IU/L (ref 0–44)
AST: 18 IU/L (ref 0–40)
Albumin/Globulin Ratio: 1.7 (ref 1.1–2.5)
Albumin: 4.5 g/dL (ref 3.5–4.7)
Alkaline Phosphatase: 100 IU/L (ref 39–117)
BUN/Creatinine Ratio: 15 (ref 10–22)
BUN: 18 mg/dL (ref 8–27)
CO2: 26 mmol/L (ref 18–29)
Calcium: 9.8 mg/dL (ref 8.6–10.2)
Chloride: 86 mmol/L — ABNORMAL LOW (ref 97–108)
Creatinine, Ser: 1.23 mg/dL (ref 0.76–1.27)
GFR calc Af Amer: 61 mL/min/{1.73_m2} (ref >59)
GFR calc non Af Amer: 53 mL/min/{1.73_m2} — ABNORMAL LOW (ref >59)
Globulin, Total: 2.7 g/dL (ref 1.5–4.5)
Glucose: 97 mg/dL (ref 65–99)
Potassium: 4.5 mmol/L (ref 3.5–5.2)
Sodium: 130 mmol/L — ABNORMAL LOW (ref 134–144)
Total Bilirubin: 0.6 mg/dL (ref 0.0–1.2)
Total Protein: 7.2 g/dL (ref 6.0–8.5)

## 2013-06-19 LAB — TSH: TSH: 3.1 u[IU]/mL (ref 0.450–4.500)

## 2013-06-23 ENCOUNTER — Telehealth: Payer: Self-pay | Admitting: Family Medicine

## 2013-07-20 DIAGNOSIS — I69920 Aphasia following unspecified cerebrovascular disease: Secondary | ICD-10-CM

## 2013-07-20 DIAGNOSIS — F0390 Unspecified dementia without behavioral disturbance: Secondary | ICD-10-CM

## 2013-07-20 DIAGNOSIS — R5381 Other malaise: Secondary | ICD-10-CM

## 2013-07-20 DIAGNOSIS — R5383 Other fatigue: Secondary | ICD-10-CM

## 2013-08-26 ENCOUNTER — Other Ambulatory Visit: Payer: Self-pay

## 2013-09-23 ENCOUNTER — Encounter: Payer: Self-pay | Admitting: Family Medicine

## 2013-09-24 ENCOUNTER — Ambulatory Visit: Payer: Medicare Other | Admitting: Family Medicine

## 2013-09-27 ENCOUNTER — Encounter: Payer: Self-pay | Admitting: Family Medicine

## 2013-09-27 ENCOUNTER — Ambulatory Visit (INDEPENDENT_AMBULATORY_CARE_PROVIDER_SITE_OTHER): Payer: Medicare Other | Admitting: Family Medicine

## 2013-09-27 VITALS — BP 165/83 | HR 80 | Temp 97.0°F | Ht 71.0 in | Wt 186.4 lb

## 2013-09-27 DIAGNOSIS — I6350 Cerebral infarction due to unspecified occlusion or stenosis of unspecified cerebral artery: Secondary | ICD-10-CM

## 2013-09-27 DIAGNOSIS — I639 Cerebral infarction, unspecified: Secondary | ICD-10-CM

## 2013-09-27 DIAGNOSIS — R4701 Aphasia: Secondary | ICD-10-CM

## 2013-09-27 DIAGNOSIS — F0390 Unspecified dementia without behavioral disturbance: Secondary | ICD-10-CM

## 2013-09-27 NOTE — Patient Instructions (Signed)
Dementia Dementia is a general term for problems with brain function. A person with dementia has memory loss and a hard time with at least one other brain function such as thinking, speaking, or problem solving. Dementia can affect social functioning, how you do your job, your mood, or your personality. The changes may be hidden for a long time. The earliest forms of this disease are usually not detected by family or friends. Dementia can be:  Irreversible.  Potentially reversible.  Partially reversible.  Progressive. This means it can get worse over time. CAUSES  Irreversible dementia causes may include:  Degeneration of brain cells (Alzheimer's disease or lewy body dementia).  Multiple small strokes (vascular dementia).  Infection (chronic meningitis or Creutzfelt-Jakob disease).  Frontotemporal dementia. This affects younger people, age 40 to 70, compared to those who have Alzheimer's disease.  Dementia associated with other disorders like Parkinson's disease, Huntington's disease, or HIV-associated dementia. Potentially or partially reversible dementia causes may include:  Medicines.  Metabolic causes such as excessive alcohol intake, vitamin B12 deficiency, or thyroid disease.  Masses or pressure in the brain such as a tumor, blood clot, or hydrocephalus. SYMPTOMS  Symptoms are often hard to detect. Family members or coworkers may not notice them early in the disease process. Different people with dementia may have different symptoms. Symptoms can include:  A hard time with memory, especially recent memory. Long-term memory may not be impaired.  Asking the same question multiple times or forgetting something someone just said.  A hard time speaking your thoughts or finding certain words.  A hard time solving problems or performing familiar tasks (such as how to use a telephone).  Sudden changes in mood.  Changes in personality, especially increasing moodiness or  mistrust.  Depression.  A hard time understanding complex ideas that were never a problem in the past. DIAGNOSIS  There are no specific tests for dementia.   Your caregiver may recommend a thorough evaluation. This is because some forms of dementia can be reversible. The evaluation will likely include a physical exam and getting a detailed history from you and a family member. The history often gives the best clues and suggestions for a diagnosis.  Memory testing may be done. A detailed brain function evaluation called neuropsychologic testing may be helpful.  Lab tests and brain imaging (such as a CT scan or MRI scan) are sometimes important.  Sometimes observation and re-evaluation over time is very helpful. TREATMENT  Treatment depends on the cause.   If the problem is a vitamin deficiency, it may be helped or cured with supplements.  For dementias such as Alzheimer's disease, medicines are available to stabilize or slow the course of the disease. There are no cures for this type of dementia.  Your caregiver can help direct you to groups, organizations, and other caregivers to help with decisions in the care of you or your loved one. HOME CARE INSTRUCTIONS The care of individuals with dementia is varied and dependent upon the progression of the dementia. The following suggestions are intended for the person living with, or caring for, the person with dementia.  Create a safe environment.  Remove the locks on bathroom doors to prevent the person from accidentally locking himself or herself in.  Use childproof latches on kitchen cabinets and any place where cleaning supplies, chemicals, or alcohol are kept.  Use childproof covers in unused electrical outlets.  Install childproof devices to keep doors and windows secured.  Remove stove knobs or install safety   knobs and an automatic shut-off on the stove.  Lower the temperature on water heaters.  Label medicines and keep them  locked up.  Secure knives, lighters, matches, power tools, and guns, and keep these items out of reach.  Keep the house free from clutter. Remove rugs or anything that might contribute to a fall.  Remove objects that might break and hurt the person.  Make sure lighting is good, both inside and outside.  Install grab rails as needed.  Use a monitoring device to alert you to falls or other needs for help.  Reduce confusion.  Keep familiar objects and people around.  Use night lights or dim lights at night.  Label items or areas.  Use reminders, notes, or directions for daily activities or tasks.  Keep a simple, consistent routine for waking, meals, bathing, dressing, and bedtime.  Create a calm, quiet environment.  Place large clocks and calendars prominently.  Display emergency numbers and home address near all telephones.  Use cues to establish different times of the day. An example is to open curtains to let the natural light in during the day.   Use effective communication.  Choose simple words and short sentences.  Use a gentle, calm tone of voice.  Be careful not to interrupt.  If the person is struggling to find a word or communicate a thought, try to provide the word or thought.  Ask one question at a time. Allow the person ample time to answer questions. Repeat the question again if the person does not respond.  Reduce nighttime restlessness.  Provide a comfortable bed.  Have a consistent nighttime routine.  Ensure a regular walking or physical activity schedule. Involve the person in daily activities as much as possible.  Limit napping during the day.  Limit caffeine.  Attend social events that stimulate rather than overwhelm the senses.  Encourage good nutrition and hydration.  Reduce distractions during meal times and snacks.  Avoid foods that are too hot or too cold.  Monitor chewing and swallowing ability.  Continue with routine vision,  hearing, dental, and medical screenings.  Only give over-the-counter or prescription medicines as directed by the caregiver.  Monitor driving abilities. Do not allow the person to drive when safe driving is no longer possible.  Register with an identification program which could provide location assistance in the event of a missing person situation. SEEK MEDICAL CARE IF:   New behavioral problems start such as moodiness, aggressiveness, or seeing things that are not there (hallucinations).  Any new problem with brain function happens. This includes problems with balance, speech, or falling a lot.  Problems with swallowing develop.  Any symptoms of other illness happen. Small changes or worsening in any aspect of brain function can be a sign that the illness is getting worse. It can also be a sign of another medical illness such as infection. Seeing a caregiver right away is important. SEEK IMMEDIATE MEDICAL CARE IF:   A fever develops.  New or worsened confusion develops.  New or worsened sleepiness develops.  Staying awake becomes hard to do. Document Released: 04/02/2001 Document Revised: 12/30/2011 Document Reviewed: 03/04/2011 ExitCare Patient Information 2014 [employer].  

## 2013-09-27 NOTE — Progress Notes (Signed)
   Subjective:    Patient ID: Walter [name], male    DOB: 07-Mar-1925, 79 y.o.   MRN: [id number]  HPI This 79 y.o. male presents for evaluation of DOTmotor vehicle evaluation.  He  Has hx of CAD, CVA, and dementia.  He is currently driving.  He has been Living by himself and cares for himself.  He has had some expressive problems  After his CVA.  His family who live in [place] state he has been independent and drives Every day and he does fine and has not had any problems.  He calls his family member Here in town and I speak with him.  Family member state he has been doing fine with driving And living alone.  He has difficulty with talking ever since the stroke but has no other deficits according To the family.   Review of Systems No chest pain, SOB, HA, dizziness, vision change, N/V, diarrhea, constipation, dysuria, urinary urgency or frequency, myalgias, arthralgias or rash.     Objective:   Physical Exam Vital signs noted  Elderly male in NAD.  HEENT - Head atraumatic Normocephalic                Eyes - PERRLA, Conjuctiva - clear Sclera- Clear EOMI                Ears - EAC's Wnl TM's Wnl Gross Hearing WNL                Throat - oropharanx wnl Respiratory - Lungs CTA bilateral Cardiac - RRR S1 and S2 without murmur GI - Abdomen soft Nontender and bowel sounds active x 4 Extremities - No edema. Neuro - Expressive aphasia.  He is having difficulty answering questions. He has been given instructions to perform nose to finger exercises to determine Fine motor skills  and he is unable to execute this.  No other deficits.        Assessment & Plan:  CVA (cerebral infarction) - Plan: Ambulatory referral to Neurology  Expressive aphasia - Plan: Ambulatory referral to Neurology  Dementia - Plan: Ambulatory referral to Neurology.  He has been driving and according to patient and family he does fine.  I am concerned he  Cannot follow commands to execute fine motor and gross  motor movements to determine His reflexes and concerned that he may have difficulties with judgement and making decisions. I am also concerned that there may be a dementia component here.  I would like him to see Neurology first and then will go with there recommendation.  Deatra Canter FNP

## 2013-09-30 ENCOUNTER — Other Ambulatory Visit: Payer: Self-pay

## 2013-09-30 DIAGNOSIS — I639 Cerebral infarction, unspecified: Secondary | ICD-10-CM

## 2013-10-04 ENCOUNTER — Other Ambulatory Visit: Payer: Self-pay | Admitting: Family Medicine

## 2013-10-11 ENCOUNTER — Ambulatory Visit (INDEPENDENT_AMBULATORY_CARE_PROVIDER_SITE_OTHER): Payer: Medicare Other | Admitting: General Practice

## 2013-10-11 VITALS — BP 184/79 | HR 67 | Temp 96.5°F | Ht 71.0 in | Wt 186.0 lb

## 2013-10-11 DIAGNOSIS — F0390 Unspecified dementia without behavioral disturbance: Secondary | ICD-10-CM

## 2013-10-11 NOTE — Progress Notes (Signed)
   Subjective:    Patient ID: Walter [name], male    DOB: [birth year], 79 y.o.   MRN: [id number]  HPI Patient presents today accompanied by his POA Walter [name]). Walter [name] reports patient had complained of constipation over past few days. Patient denies constipation and had bowel movement this am. Walter [name] also reports patient seems more confused and is making poor choices with finances. Patient does have a history of dementia. Patient denies being confused and knows what he is doing in reference to finances. Walter [name] wants to know if any action can be taken medically to order patient go live with relative or caregiver. POA denies patient being a threat to himself or others, but concerned about financial choices. Provider informed POA that, if he feels like patient is unable to care for himself, maybe home health could assist. Provider asked if home health had been contacted, Walter [name] verbalized that it is too expensive to have 24 hours care.  Informed Walter [name] that the person who makes home health referrals could come in and discuss possible options. Walter [name] verbalized he would continue trying to convince patient to come live with sister; otherwise [facility] stay with patient.      Review of Systems  Constitutional: Negative for fever and chills.  Respiratory: Negative for chest tightness and shortness of breath.   Cardiovascular: Negative for chest pain and palpitations.  Gastrointestinal: Negative for constipation.       Objective:   Physical Exam  Constitutional: He appears well-developed and well-nourished.  HENT:  Head: Normocephalic and atraumatic.  Right Ear: External ear normal.  Left Ear: External ear normal.  Cardiovascular: Normal rate, regular rhythm and normal heart sounds.   Pulmonary/Chest: Effort normal and breath sounds normal. No respiratory distress. He exhibits no tenderness.  Neurological: He is alert.  Oriented to self and place. Patient has dementia  Skin: Skin is warm and dry.           Assessment & Plan:  1. Dementia -Continue to monitor -POA consider options discussed by home health (assisted living, in home care assistance) -RTO prn -Patient and POA verbalized understanding Walter Keens, FNP-C

## 2013-10-14 ENCOUNTER — Encounter: Payer: Self-pay | Admitting: Family Medicine

## 2013-10-20 ENCOUNTER — Other Ambulatory Visit: Payer: Self-pay | Admitting: *Deleted

## 2013-10-20 MED ORDER — METOPROLOL SUCCINATE ER 50 MG PO TB24: 50.0000 mg | ORAL_TABLET | Freq: Every day | ORAL | Status: AC

## 2013-10-20 NOTE — Telephone Encounter (Signed)
Patient hasn't been taking is blood pressure or thyroid medication. He has home health services through [place] and refuses to come in to the office for a recheck. Labs are needed but [facility] doesn't draw labs. Blood pressure by home health nurse has been elevated. Metoprolol refilled. Discussed with caregiver. He will try to talk Desean into coming in for a follow-up.

## 2013-10-21 NOTE — Patient Instructions (Signed)
Dementia Dementia is a general term for problems with brain function. A person with dementia has memory loss and a hard time with at least one other brain function such as thinking, speaking, or problem solving. Dementia can affect social functioning, how you do your job, your mood, or your personality. The changes may be hidden for a long time. The earliest forms of this disease are usually not detected by family or friends. Dementia can be:  Irreversible.  Potentially reversible.  Partially reversible.  Progressive. This means it can get worse over time. CAUSES  Irreversible dementia causes may include:  Degeneration of brain cells (Alzheimer's disease or lewy body dementia).  Multiple small strokes (vascular dementia).  Infection (chronic meningitis or Creutzfelt-Jakob disease).  Frontotemporal dementia. This affects younger people, age 40 to 70, compared to those who have Alzheimer's disease.  Dementia associated with other disorders like Parkinson's disease, Huntington's disease, or HIV-associated dementia. Potentially or partially reversible dementia causes may include:  Medicines.  Metabolic causes such as excessive alcohol intake, vitamin B12 deficiency, or thyroid disease.  Masses or pressure in the brain such as a tumor, blood clot, or hydrocephalus. SYMPTOMS  Symptoms are often hard to detect. Family members or coworkers may not notice them early in the disease process. Different people with dementia may have different symptoms. Symptoms can include:  A hard time with memory, especially recent memory. Long-term memory may not be impaired.  Asking the same question multiple times or forgetting something someone just said.  A hard time speaking your thoughts or finding certain words.  A hard time solving problems or performing familiar tasks (such as how to use a telephone).  Sudden changes in mood.  Changes in personality, especially increasing moodiness or  mistrust.  Depression.  A hard time understanding complex ideas that were never a problem in the past. DIAGNOSIS  There are no specific tests for dementia.   Your caregiver may recommend a thorough evaluation. This is because some forms of dementia can be reversible. The evaluation will likely include a physical exam and getting a detailed history from you and a family member. The history often gives the best clues and suggestions for a diagnosis.  Memory testing may be done. A detailed brain function evaluation called neuropsychologic testing may be helpful.  Lab tests and brain imaging (such as a CT scan or MRI scan) are sometimes important.  Sometimes observation and re-evaluation over time is very helpful. TREATMENT  Treatment depends on the cause.   If the problem is a vitamin deficiency, it may be helped or cured with supplements.  For dementias such as Alzheimer's disease, medicines are available to stabilize or slow the course of the disease. There are no cures for this type of dementia.  Your caregiver can help direct you to groups, organizations, and other caregivers to help with decisions in the care of you or your loved one. HOME CARE INSTRUCTIONS The care of individuals with dementia is varied and dependent upon the progression of the dementia. The following suggestions are intended for the person living with, or caring for, the person with dementia.  Create a safe environment.  Remove the locks on bathroom doors to prevent the person from accidentally locking himself or herself in.  Use childproof latches on kitchen cabinets and any place where cleaning supplies, chemicals, or alcohol are kept.  Use childproof covers in unused electrical outlets.  Install childproof devices to keep doors and windows secured.  Remove stove knobs or install safety   knobs and an automatic shut-off on the stove.  Lower the temperature on water heaters.  Label medicines and keep them  locked up.  Secure knives, lighters, matches, power tools, and guns, and keep these items out of reach.  Keep the house free from clutter. Remove rugs or anything that might contribute to a fall.  Remove objects that might break and hurt the person.  Make sure lighting is good, both inside and outside.  Install grab rails as needed.  Use a monitoring device to alert you to falls or other needs for help.  Reduce confusion.  Keep familiar objects and people around.  Use night lights or dim lights at night.  Label items or areas.  Use reminders, notes, or directions for daily activities or tasks.  Keep a simple, consistent routine for waking, meals, bathing, dressing, and bedtime.  Create a calm, quiet environment.  Place large clocks and calendars prominently.  Display emergency numbers and home address near all telephones.  Use cues to establish different times of the day. An example is to open curtains to let the natural light in during the day.   Use effective communication.  Choose simple words and short sentences.  Use a gentle, calm tone of voice.  Be careful not to interrupt.  If the person is struggling to find a word or communicate a thought, try to provide the word or thought.  Ask one question at a time. Allow the person ample time to answer questions. Repeat the question again if the person does not respond.  Reduce nighttime restlessness.  Provide a comfortable bed.  Have a consistent nighttime routine.  Ensure a regular walking or physical activity schedule. Involve the person in daily activities as much as possible.  Limit napping during the day.  Limit caffeine.  Attend social events that stimulate rather than overwhelm the senses.  Encourage good nutrition and hydration.  Reduce distractions during meal times and snacks.  Avoid foods that are too hot or too cold.  Monitor chewing and swallowing ability.  Continue with routine vision,  hearing, dental, and medical screenings.  Only give over-the-counter or prescription medicines as directed by the caregiver.  Monitor driving abilities. Do not allow the person to drive when safe driving is no longer possible.  Register with an identification program which could provide location assistance in the event of a missing person situation. SEEK MEDICAL CARE IF:   New behavioral problems start such as moodiness, aggressiveness, or seeing things that are not there (hallucinations).  Any new problem with brain function happens. This includes problems with balance, speech, or falling a lot.  Problems with swallowing develop.  Any symptoms of other illness happen. Small changes or worsening in any aspect of brain function can be a sign that the illness is getting worse. It can also be a sign of another medical illness such as infection. Seeing a caregiver right away is important. SEEK IMMEDIATE MEDICAL CARE IF:   A fever develops.  New or worsened confusion develops.  New or worsened sleepiness develops.  Staying awake becomes hard to do. Document Released: 04/02/2001 Document Revised: 12/30/2011 Document Reviewed: 03/04/2011 ExitCare Patient Information 2014 [employer].  

## 2013-10-25 ENCOUNTER — Encounter: Payer: Self-pay | Admitting: Family Medicine

## 2013-11-02 ENCOUNTER — Encounter: Payer: Self-pay | Admitting: Family Medicine

## 2013-11-03 ENCOUNTER — Ambulatory Visit: Payer: Self-pay | Admitting: Neurology

## 2015-04-17 ENCOUNTER — Other Ambulatory Visit: Payer: Self-pay

## 2015-09-12 ENCOUNTER — Inpatient Hospital Stay (HOSPITAL_COMMUNITY)
Admission: AD | Admit: 2015-09-12 | Discharge: 2015-09-14 | DRG: 066 | Disposition: A | Discharge status: Home / Self Care | Payer: Medicare Other | Source: Other Acute Inpatient Hospital | Attending: Neurology | Admitting: Neurology

## 2015-09-12 DIAGNOSIS — F0390 Unspecified dementia without behavioral disturbance: Secondary | ICD-10-CM | POA: Diagnosis present

## 2015-09-12 DIAGNOSIS — Z8673 Personal history of transient ischemic attack (TIA), and cerebral infarction without residual deficits: Secondary | ICD-10-CM | POA: Diagnosis not present

## 2015-09-12 DIAGNOSIS — I10 Essential (primary) hypertension: Secondary | ICD-10-CM | POA: Diagnosis present

## 2015-09-12 DIAGNOSIS — I619 Nontraumatic intracerebral hemorrhage, unspecified: Secondary | ICD-10-CM | POA: Diagnosis present

## 2015-09-12 DIAGNOSIS — Z87891 Personal history of nicotine dependence: Secondary | ICD-10-CM | POA: Diagnosis not present

## 2015-09-12 DIAGNOSIS — E039 Hypothyroidism, unspecified: Secondary | ICD-10-CM | POA: Diagnosis present

## 2015-09-12 DIAGNOSIS — F802 Mixed receptive-expressive language disorder: Secondary | ICD-10-CM | POA: Diagnosis present

## 2015-09-12 DIAGNOSIS — I611 Nontraumatic intracerebral hemorrhage in hemisphere, cortical: Secondary | ICD-10-CM | POA: Diagnosis not present

## 2015-09-12 DIAGNOSIS — W1781XA Fall down embankment (hill), initial encounter: Secondary | ICD-10-CM | POA: Diagnosis not present

## 2015-09-12 DIAGNOSIS — S0181XA Laceration without foreign body of other part of head, initial encounter: Secondary | ICD-10-CM | POA: Diagnosis present

## 2015-09-12 DIAGNOSIS — E079 Disorder of thyroid, unspecified: Secondary | ICD-10-CM | POA: Diagnosis present

## 2015-09-12 DIAGNOSIS — R531 Weakness: Secondary | ICD-10-CM | POA: Diagnosis present

## 2015-09-12 DIAGNOSIS — H5347 Heteronymous bilateral field defects: Secondary | ICD-10-CM | POA: Diagnosis present

## 2015-09-12 DIAGNOSIS — Z8679 Personal history of other diseases of the circulatory system: Secondary | ICD-10-CM | POA: Diagnosis not present

## 2015-09-12 DIAGNOSIS — I6789 Other cerebrovascular disease: Secondary | ICD-10-CM | POA: Diagnosis not present

## 2015-09-12 NOTE — Progress Notes (Signed)
Berneta Sagesnthony South (cousin) is patient's POA.

## 2015-09-13 ENCOUNTER — Inpatient Hospital Stay (HOSPITAL_COMMUNITY): Payer: Medicare Other

## 2015-09-13 ENCOUNTER — Encounter (HOSPITAL_COMMUNITY): Payer: Self-pay | Admitting: *Deleted

## 2015-09-13 DIAGNOSIS — I6789 Other cerebrovascular disease: Secondary | ICD-10-CM

## 2015-09-13 DIAGNOSIS — I611 Nontraumatic intracerebral hemorrhage in hemisphere, cortical: Secondary | ICD-10-CM

## 2015-09-13 DIAGNOSIS — Z8679 Personal history of other diseases of the circulatory system: Secondary | ICD-10-CM

## 2015-09-13 DIAGNOSIS — I619 Nontraumatic intracerebral hemorrhage, unspecified: Secondary | ICD-10-CM | POA: Diagnosis present

## 2015-09-13 LAB — CBC
HEMATOCRIT: 38.5 % — AB (ref 39.0–52.0)
Hemoglobin: 12.5 g/dL — ABNORMAL LOW (ref 13.0–17.0)
MCH: 29.5 pg (ref 26.0–34.0)
MCHC: 32.5 g/dL (ref 30.0–36.0)
MCV: 90.8 fL (ref 78.0–100.0)
Platelets: 164 10*3/uL (ref 150–400)
RBC: 4.24 MIL/uL (ref 4.22–5.81)
RDW: 13.9 % (ref 11.5–15.5)
WBC: 8.5 10*3/uL (ref 4.0–10.5)

## 2015-09-13 LAB — COMPREHENSIVE METABOLIC PANEL
ALBUMIN: 4.4 g/dL (ref 3.5–5.0)
ALT: 14 U/L — ABNORMAL LOW (ref 17–63)
AST: 24 U/L (ref 15–41)
Alkaline Phosphatase: 82 U/L (ref 38–126)
Anion gap: 10 (ref 5–15)
BUN: 20 mg/dL (ref 6–20)
CHLORIDE: 104 mmol/L (ref 101–111)
CO2: 25 mmol/L (ref 22–32)
Calcium: 9.6 mg/dL (ref 8.9–10.3)
Creatinine, Ser: 1.25 mg/dL — ABNORMAL HIGH (ref 0.61–1.24)
GFR calc Af Amer: 57 mL/min — ABNORMAL LOW (ref >60)
GFR calc non Af Amer: 50 mL/min — ABNORMAL LOW (ref >60)
GLUCOSE: 110 mg/dL — AB (ref 65–99)
POTASSIUM: 4.9 mmol/L (ref 3.5–5.1)
SODIUM: 139 mmol/L (ref 135–145)
Total Bilirubin: 0.6 mg/dL (ref 0.3–1.2)
Total Protein: 7.8 g/dL (ref 6.5–8.1)

## 2015-09-13 LAB — MRSA PCR SCREENING: MRSA BY PCR: NEGATIVE

## 2015-09-13 MED ORDER — ACETAMINOPHEN 650 MG RE SUPP: 650.0000 mg | RECTAL | Status: DC | PRN

## 2015-09-13 MED ORDER — PERFLUTREN LIPID MICROSPHERE
1.0000 mL | INTRAVENOUS | Status: AC | PRN
  Administered 2015-09-13: 2 mL via INTRAVENOUS
  Filled 2015-09-13: qty 10

## 2015-09-13 MED ORDER — ACETAMINOPHEN 325 MG PO TABS: 650.0000 mg | ORAL_TABLET | ORAL | Status: DC | PRN

## 2015-09-13 MED ORDER — LABETALOL HCL 5 MG/ML IV SOLN: 10.0000 mg | INTRAVENOUS | Status: DC | PRN

## 2015-09-13 MED ORDER — SENNOSIDES-DOCUSATE SODIUM 8.6-50 MG PO TABS
1.0000 | ORAL_TABLET | Freq: Two times a day (BID) | ORAL | Status: DC
  Administered 2015-09-13 – 2015-09-14 (×3): 1 via ORAL
  Filled 2015-09-13 (×3): qty 1

## 2015-09-13 MED ORDER — PANTOPRAZOLE SODIUM 40 MG PO TBEC
40.0000 mg | DELAYED_RELEASE_TABLET | Freq: Every day | ORAL | Status: DC
  Filled 2015-09-13: qty 1

## 2015-09-13 MED ORDER — PANTOPRAZOLE SODIUM 40 MG IV SOLR
40.0000 mg | Freq: Every day | INTRAVENOUS | Status: DC
  Administered 2015-09-13: 40 mg via INTRAVENOUS
  Filled 2015-09-13: qty 40

## 2015-09-13 MED ORDER — STROKE: EARLY STAGES OF RECOVERY BOOK
Freq: Once | Status: AC
  Administered 2015-09-13: 03:00:00
  Filled 2015-09-13: qty 1

## 2015-09-13 MED ORDER — LEVOTHYROXINE SODIUM 25 MCG PO TABS: 50.0000 ug | ORAL_TABLET | Freq: Every day | ORAL | Status: DC

## 2015-09-13 MED ORDER — LORAZEPAM 2 MG/ML IJ SOLN
1.0000 mg | Freq: Once | INTRAMUSCULAR | Status: AC
  Administered 2015-09-13: 1 mg via INTRAVENOUS
  Filled 2015-09-13: qty 1

## 2015-09-13 NOTE — Care Management Note (Signed)
Case Management Note  Patient Details  Name: Margette FastRobert J Kelso MRN: [id number] Date of Birth: 04-Apr-1925  Subjective/Objective:                    Action/Plan:  Await PT / OT evals . Initial UR completed  Expected Discharge Date:  09/15/15               Expected Discharge Plan:  Home/Self Care  In-House Referral:     Discharge planning Services     Post Acute Care Choice:    Choice offered to:     DME Arranged:    DME Agency:     HH Arranged:    HH Agency:     Status of Service:  In process, will continue to follow  Medicare Important Message Given:    Date Medicare IM Given:    Medicare IM give by:    Date Additional Medicare IM Given:    Additional Medicare Important Message give by:     If discussed at Long Length of Stay Meetings, dates discussed:    Additional Comments:  Kingsley PlanWile, Dinorah Masullo Marie, RN 09/13/2015, 8:41 AM

## 2015-09-13 NOTE — Progress Notes (Signed)
STROKE TEAM PROGRESS NOTE   HISTORY Walter [name] is a 81 y.o. male with a history of hypertension, previous ICH, who presents with fall. He was evaluated at [facility] where a head CT was performed and showed an ICH. His son states that he was walking and stepped off of a small embankment and then fell in a manner that appeared to be a mechanical fall and hit his head. He did seem possibly weak when he was getting him up, but not in one place in particular.  Neurosurgery here was consulted, but did not feel that it was likely to be surgical and not definitely related to trauma and therefore Neurology was called.   Of note, he has a history of hemorrhage into a similar location. Since a stroke earlier this year, he has had persistent difficulty with speech.  Also of note, he refuses to take any medications at home.   He was LKW 09/12/2015 in the evening. ICH Score 1.  He was admitted to the neuro ICU for further evaluation and treatment.   SUBJECTIVE (INTERVAL HISTORY) His sitter and RN are at the bedside.  Overall his nephew feels his condition is stable, appears at baseline to him. BP has been stable. Left frontal laceration.  MRI pending.    OBJECTIVE Temp:  [98.8 F (37.1 C)-98.9 F (37.2 C)] 98.9 F (37.2 C) (11/23 0800) Pulse Rate:  [73-106] 75 (11/23 0800) Cardiac Rhythm:  [-] Normal sinus rhythm (11/23 0800) Resp:  [11-32] 16 (11/23 0800) BP: (120-169)/(69-121) 135/69 mmHg (11/23 0800) SpO2:  [97 %-100 %] 97 % (11/23 0800) Weight:  [78.7 kg (173 lb 8 oz)] 78.7 kg (173 lb 8 oz) (11/23 0000)  CBC:   Recent Labs Lab 09/13/15 0136  WBC 8.5  HGB 12.5*  HCT 38.5*  MCV 90.8  PLT 164    Basic Metabolic Panel:   Recent Labs Lab 09/13/15 0136  NA 139  K 4.9  CL 104  CO2 25  GLUCOSE 110*  BUN 20  CREATININE 1.25*  CALCIUM 9.6    Lipid Panel: No results found for: CHOL, TRIG, HDL, CHOLHDL, VLDL, LDLCALC HgbA1c: No results found for: HGBA1C Urine Drug  Screen: No results found for: LABOPIA, COCAINSCRNUR, LABBENZ, AMPHETMU, THCU, LABBARB    IMAGING I have personally reviewed the radiological images below and agree with the radiology interpretations.  CT head And neck without contrast [facility]) 1. acute 3.3 cm ICH in the  High left parietal lobe with surrounding SAH along the left parietal lobe and posterior aspect of the left frontal lobe near the vertex. No midline shift. 2. Chronic ischemic microvascular white matter disease. 3. Old encephalomalacia in the left temporal parietal region 4. Left supraorbital soft tissue swelling 5. Cervical spondylosis and degenerative disc disease with osseous foraminal stenosis at several levels, but no acute fracture or acute subluxation.   CT head repeat -  Pending   2-D echo - Left ventricle: The cavity size was normal. Wall thickness was increased in a pattern of mild LVH. Systolic function was normal. The estimated ejection fraction was in the range of 55% to 60%. Wall motion was normal; there were no regional wall motion abnormalities. - Aortic valve: There was trivial regurgitation. - Mitral valve: There was mild regurgitation. - Atrial septum: No defect or patent foramen ovale was identified.  PHYSICAL EXAM  Temp:  [98.8 F (37.1 C)-98.9 F (37.2 C)] 98.9 F (37.2 C) (11/23 0800) Pulse Rate:  [73-106] 90 (11/23 1100) Resp:  [  11-32] 18 (11/23 1100) BP: (120-169)/(51-121) 136/75 mmHg (11/23 1100) SpO2:  [97 %-100 %] 98 % (11/23 1100) Weight:  [173 lb 8 oz (78.7 kg)] 173 lb 8 oz (78.7 kg) (11/23 0000)  General - Well nourished, well developed, in no apparent distress.  Ophthalmologic - Fundi not visualized due to  noncooperation.  Cardiovascular - Regular rate and rhythm.  Neuro -  Awake alert, know his name,  But not orientated to age, time, people or situation.  Global aphasia, receptive > expressive, not following commands, not able to name, but able to repeat  simple sentence. PERRL, EOMI, attending to both directions, no neglect, not consistent blinking to visual threat bilaterally, facial symmetry, tongue in midline, move all extremities equally and symmetric. Responding to pain stimulation bilaterally, DTR 1+ and no babinski. Gait and coordination not able to test   ASSESSMENT/PLAN Walter [name] is a 81 y.o. male with history of hypertension and previous ICH presenting following a fall with generalized weakness. [facility]raparench[facility] ICH.  Left parietal high convexity ICH with focal SAH - traumatic vs. CAA  Neurosurgical consult - no intervention warranted  Resultant  Global aphasia, receptive > expressive  MRI  Cancelled due to intolerance  Repeat CT pending  2D Echo  EF 55-60%  SCDs for VTE prophylaxis DIET SOFT Room service appropriate?: Yes; Fluid consistency:: Thin  No antithrombotic prior to admission, no antithrombotics this admission due to ICH  Ongoing aggressive stroke risk factor management  Cancel sitter  Therapy recommendations:  Pending. Ok to be OOB if MRI stable.  Disposition:  pending (lives at home alone, nephew checks on him)  Hx of ICH  05/2010 spontaneous L temporal ICH proceeded by word finding difficulties x 2 days, presume to be hypertensive etiology. Admitted by Dr. Jordan LikesPool.  Language deficit since then as per family  MRI not able to do to confirm or rule out CAA this time  ? Hypertension  BP 120/71 on arrival  BP remains stable  No BP meds so far  Home meds:  Metoprolol 50 daily  Other Stroke Risk Factors  Advanced age  Former Cigarette smoker, quit smoking 32 years ago   Hx stroke/TIA -   Other Active Problems  Thyroid disease  Hospital day # 1  This patient is critically ill due to ICH with SAH and at significant risk of neurological worsening, death form hematoma enlargement, cerebral edema or brain herniation. This patient's care requires  constant monitoring of vital signs, hemodynamics, respiratory and cardiac monitoring, review of multiple databases, neurological assessment, discussion with family, other specialists and medical decision making of high complexity. I spent 40 minutes of neurocritical care time in the care of this patient.  Marvel PlanJindong Harriett Azar, MD PhD Stroke Neurology 09/13/2015 2:44 PM    To contact Stroke Continuity provider, please refer to [email or web address]. After hours, contact General Neurology

## 2015-09-13 NOTE — Progress Notes (Signed)
Echocardiogram 2D Echocardiogram has been performed.  Walter [name], Walter [name] M 09/13/2015, 11:59 AM

## 2015-09-13 NOTE — H&P (Addendum)
Neurology H&P  CC: fall  History is obtained from: Patient, nephew, cousin  HPI: Walter [name] is a 81 y.o. male with a history of hypertension, previous ICH, who presents with fall. He was evaluated at [facility] where a head CT was performed and showed an ICH. His son states that he was walking and stepped off of a small embankment and then fell in a manner that appeared to be a mechanical fall and hit his head. He did seem possibly weak when he was getting him up, but not in one place in particular.  Neurosurgery here was consulted, but did not feel that it was likely to be surgical and not definitely related to trauma and therefore Neurology was called.   Of note, he has a history of hemorrhage into a similar location. Since a stroke earlier this year, he has had persistent difficulty with speech.   Also of note, he refuses to take any medications at home.   LKW: earlier this evening tpa given?: no, hemorrhage ICH Score: 1   ROS: A 14 point ROS was performed and is negative except as noted in the HPI.   Past Medical History  Diagnosis Date  . Hypertension   . Thyroid disease     hypothyroidism  . Stroke 2010     FHx: Unable to assess secondary to patient's altered mental status.     Social History:  reports that he quit smoking about 32 years ago. He has never used smokeless tobacco. He reports that he does not drink alcohol or use illicit drugs.   Exam: Current vital signs: Filed Vitals:   09/13/15 0000  BP: 120/71  Pulse: 101  Resp: 26   Vital signs in last 24 hours:     Physical Exam  Constitutional: Appears well-developed and well-nourished.  Psych: Affect appropriate to situation Eyes: No scleral injection HENT: bandaged laceration over the left forehead.  Head: Normocephalic.  Cardiovascular: Normal rate and regular rhythm.  Respiratory: Effort normal  GI: Soft.  No distension. There is no tenderness.  Skin: WDI  Neuro: Mental  Status: Patient is awake, alert, he has a moderate receptive aphasia.  No signs of aphasia or neglect Cranial Nerves: II: R hemianopia, though possibly some vision in the lower field. Pupils are equal, round, and reactive to light.   III,IV, VI: EOMI without ptosis or diploplia.  V: Facial sensation is symmetric to temperature VII: Facial movement is symmetric.  VIII: hearing is intact to voice X: Uvula elevates symmetrically XI: Shoulder shrug is symmetric. XII: tongue is midline without atrophy or fasciculations.  Motor: Tone is normal. Bulk is normal. 5/5 strength was present in all four extremities.  Sensory: Sensation is symmetric to light touch and temperature in the arms and legs. Cerebellar: No clear ataxia   I have reviewed the images obtained:CT head - left iph with extension into the subarachnoid space.   Impression: 81 yo M with IPH. It is not in the correct location for coup-contrecoup injury, but I do wonder if given the previous injury it was more susceptible to injury from the fall. Also possible would be that he bled and this contributed to the fall. Given his age, I also wonder about amyloid angiopathy. In an 81 yo patient who appears at his baseline to family and patient, I would not favor aggressive surgical intervention at this time, but if patient worsens then may further discuss with neurosurgery at that time.  Recommendations: 1) Admit to ICU 2) no antiplatelets  or anticoagulants 3) blood pressure control with goal systolic < 140 4) Frequent neuro checks 5) If symptoms worsen or there is decreased mental status, repeat stat head CT 6) MRI brain to assess for amyloid.  7) PT,OT,ST    This patient is critically ill and at significant risk of neurological worsening, death and care requires constant monitoring of vital signs, hemodynamics,respiratory and cardiac monitoring, neurological assessment, discussion with family, other specialists and medical decision  making of high complexity. I spent 40 minutes of neurocritical care time  in the care of  this patient.   Ritta SlotMcNeill Shi Grose, MD [facility] [phone]  If 7pm- 7am, please page neurology on call as listed in AMION. 09/13/2015  1:17 AM

## 2015-09-13 NOTE — Plan of Care (Signed)
Problem: Tissue Perfusion: Goal: Complications of Ischemic Stroke will be minimized. Outcome: Not Applicable Date Met:  31/28/11 ICH Goal: Complications of Spontaneous Subarachnoid Hemorrhage will be minimized. Outcome: Not Applicable Date Met:  88/67/73 ICH

## 2015-09-14 DIAGNOSIS — I10 Essential (primary) hypertension: Secondary | ICD-10-CM

## 2015-09-14 DIAGNOSIS — F0390 Unspecified dementia without behavioral disturbance: Secondary | ICD-10-CM

## 2015-09-14 DIAGNOSIS — E079 Disorder of thyroid, unspecified: Secondary | ICD-10-CM | POA: Diagnosis present

## 2015-09-14 LAB — CBC
HCT: 37.8 % — ABNORMAL LOW (ref 39.0–52.0)
Hemoglobin: 12.5 g/dL — ABNORMAL LOW (ref 13.0–17.0)
MCH: 29.6 pg (ref 26.0–34.0)
MCHC: 33.1 g/dL (ref 30.0–36.0)
MCV: 89.6 fL (ref 78.0–100.0)
PLATELETS: 181 10*3/uL (ref 150–400)
RBC: 4.22 MIL/uL (ref 4.22–5.81)
RDW: 13.9 % (ref 11.5–15.5)
WBC: 7.9 10*3/uL (ref 4.0–10.5)

## 2015-09-14 LAB — BASIC METABOLIC PANEL
Anion gap: 9 (ref 5–15)
BUN: 19 mg/dL (ref 6–20)
CALCIUM: 9.4 mg/dL (ref 8.9–10.3)
CO2: 25 mmol/L (ref 22–32)
CREATININE: 1.35 mg/dL — AB (ref 0.61–1.24)
Chloride: 101 mmol/L (ref 101–111)
GFR calc non Af Amer: 45 mL/min — ABNORMAL LOW (ref >60)
GFR, EST AFRICAN AMERICAN: 52 mL/min — AB (ref >60)
Glucose, Bld: 110 mg/dL — ABNORMAL HIGH (ref 65–99)
Potassium: 4.2 mmol/L (ref 3.5–5.1)
SODIUM: 135 mmol/L (ref 135–145)

## 2015-09-14 LAB — LIPID PANEL
CHOL/HDL RATIO: 6.4 ratio
Cholesterol: 286 mg/dL — ABNORMAL HIGH (ref 0–200)
HDL: 45 mg/dL (ref >40)
LDL CALC: 220 mg/dL — AB (ref 0–99)
Triglycerides: 103 mg/dL (ref <150)
VLDL: 21 mg/dL (ref 0–40)

## 2015-09-14 LAB — T4, FREE: FREE T4: 0.81 ng/dL (ref 0.61–1.12)

## 2015-09-14 LAB — FOLATE: Folate: 5.5 ng/mL — ABNORMAL LOW (ref >5.9)

## 2015-09-14 LAB — VITAMIN B12: Vitamin B-12: 179 pg/mL — ABNORMAL LOW (ref 180–914)

## 2015-09-14 LAB — TSH: TSH: 4.476 u[IU]/mL (ref 0.350–4.500)

## 2015-09-14 NOTE — Discharge Summary (Signed)
Stroke Discharge Summary  Patient ID: Walter [name]   MRN: [id number]      DOB: 02/04/1925  Date of Admission: 09/12/2015 Date of Discharge: 09/14/2015  Attending Physician:  Marvel Plan, MD, Stroke MD Patient's PCP:  Deatra Canter, FNP  DISCHARGE DIAGNOSIS:  Principal Problem:   ICH (intracerebral hemorrhage) (HCC) - trauma vs cerebral amyloid angiopathy Active Problems:   Dementia   Thyroid disease   Essential hypertension   Past Medical History  Diagnosis Date  . Hypertension   . Thyroid disease     hypothyroidism  . Stroke [facility]) 2010   Past Surgical History  Procedure Laterality Date  . Coronary stent placement        Medication List    TAKE these medications        levothyroxine 50 MCG tablet  Commonly known as:  SYNTHROID, LEVOTHROID  Take 50 mcg by mouth daily before breakfast.     metoprolol succinate 50 MG 24 hr tablet  Commonly known as:  TOPROL XL  Take 1 tablet (50 mg total) by mouth daily. Take with or immediately following a meal.        LABORATORY STUDIES CBC    Component Value Date/Time   WBC 7.9 09/14/2015 0446   WBC 7.7 06/18/2013 1029   RBC 4.22 09/14/2015 0446   RBC 4.5* 06/18/2013 1029   HGB 12.5* 09/14/2015 0446   HGB 13.3* 06/18/2013 1029   HCT 37.8* 09/14/2015 0446   HCT 38.9* 06/18/2013 1029   PLT 181 09/14/2015 0446   MCV 89.6 09/14/2015 0446   MCV 87.4 06/18/2013 1029   MCH 29.6 09/14/2015 0446   MCH 29.9 06/18/2013 1029   MCHC 33.1 09/14/2015 0446   MCHC 34.2 06/18/2013 1029   RDW 13.9 09/14/2015 0446   LYMPHSABS 1.7 05/31/2010 1150   MONOABS 0.7 05/31/2010 1150   EOSABS 0.1 05/31/2010 1150   BASOSABS 0.0 05/31/2010 1150   CMP    Component Value Date/Time   NA 135 09/14/2015 0446   NA 130* 06/18/2013 1028   K 4.2 09/14/2015 0446   CL 101 09/14/2015 0446   CO2 25 09/14/2015 0446   GLUCOSE 110* 09/14/2015 0446   GLUCOSE 97 06/18/2013 1028   BUN 19 09/14/2015 0446   BUN 18 06/18/2013 1028   CREATININE 1.35* 09/14/2015 0446   CALCIUM 9.4 09/14/2015 0446   PROT 7.8 09/13/2015 0136   PROT 7.2 06/18/2013 1028   ALBUMIN 4.4 09/13/2015 0136   ALBUMIN 4.5 06/18/2013 1028   AST 24 09/13/2015 0136   ALT 14* 09/13/2015 0136   ALKPHOS 82 09/13/2015 0136   BILITOT 0.6 09/13/2015 0136   GFRNONAA 45* 09/14/2015 0446   GFRAA 52* 09/14/2015 0446   COAGS Lab Results  Component Value Date   INR 0.96 05/31/2010   Lipid Panel    Component Value Date/Time   CHOL 286* 09/14/2015 0446   TRIG 103 09/14/2015 0446   HDL 45 09/14/2015 0446   CHOLHDL 6.4 09/14/2015 0446   VLDL 21 09/14/2015 0446   LDLCALC 220* 09/14/2015 0446   HgbA1C No results found for: HGBA1C Cardiac Panel (last 3 results) No results for input(s): CKTOTAL, CKMB, TROPONINI, RELINDX in the last 72 hours. Urinalysis    Component Value Date/Time   COLORURINE YELLOW 05/31/2010 1146   APPEARANCEUR CLEAR 05/31/2010 1146   LABSPEC 1.006 05/31/2010 1146   PHURINE 6.0 05/31/2010 1146   GLUCOSEU NEGATIVE 05/31/2010 1146   HGBUR NEGATIVE 05/31/2010 1146   BILIRUBINUR  NEGATIVE 05/31/2010 1146   KETONESUR NEGATIVE 05/31/2010 1146   PROTEINUR NEGATIVE 05/31/2010 1146   UROBILINOGEN 0.2 05/31/2010 1146   NITRITE NEGATIVE 05/31/2010 1146   LEUKOCYTESUR  05/31/2010 1146    NEGATIVE MICROSCOPIC NOT DONE ON URINES WITH NEGATIVE PROTEIN, BLOOD, LEUKOCYTES, NITRITE, OR GLUCOSE <1000 mg/dL.   Urine Drug Screen No results found for: LABOPIA, COCAINSCRNUR, LABBENZ, AMPHETMU, THCU, LABBARB  Alcohol Level No results found for: [facility]   SIGNIFICANT DIAGNOSTIC STUDIES CT head And neck without contrast [facility]) 1. acute 3.3 cm ICH in the High left parietal lobe with surrounding SAH along the left parietal lobe and posterior aspect of the left frontal lobe near the vertex. No midline shift. 2. Chronic ischemic microvascular white matter disease. 3. Old encephalomalacia in the left temporal parietal region 4. Left  supraorbital soft tissue swelling 5. Cervical spondylosis and degenerative disc disease with osseous foraminal stenosis at several levels, but no acute fracture or acute subluxation.  CT head repeat  09/13/2015 1. Increased fullness of a ~ 4 cm left posterior cerebral hematoma compared to yesterday, as described above, without increased mass effect or shift. Regional subarachnoid extension remains small volume. 2. Left temporal gliosis from previous lobar hemorrhage, question amyloid angiopathy.   Dg Chest 1 View 09/13/2015 Chronic interstitial changes without acute abnormality.  2-D echo - Left ventricle: The cavity size was normal. Wall thickness wasincreased in a pattern of mild LVH. Systolic function was normal.The estimated ejection fraction was in the range of 55% to 60%.Wall motion was normal; there were no regional wall motionabnormalities. - Aortic valve: There was trivial regurgitation. - Mitral valve: There was mild regurgitation. - Atrial septum: No defect or patent foramen ovale was identified.     HISTORY OF PRESENT ILLNESS Walter [name] is a 81 y.o. male with a history of hypertension, previous ICH, who presents with fall. He was evaluated at [facility] where a head CT was performed and showed an ICH. His son states that he was walking and stepped off of a small embankment and then fell in a manner that appeared to be a mechanical fall and hit his head (during hospitalization, nephew described the fall as he could not hold himself up, and slid down to the ground, hitting his glasses which led to cut on head. He did not feel he hit his head directly). He did seem possibly weak when he was getting him up, but not in one place in particular.  Neurosurgery here was consulted, but did not feel that it was likely to be surgical and not definitely related to trauma and therefore Neurology was called.   Of note, he has a history of hemorrhage into a similar location.  Since a stroke earlier this year, he has had persistent difficulty with speech. Also of note, he refuses to take any medications at home.   He was LKW 09/12/2015 in the evening. ICH Score 1. He was admitted to the neuro ICU for further evaluation and treatment.   HOSPITAL COURSE Walter [name] is a 81 y.o. male with history of hypertension and previous ICH presenting following a fall with generalized weakness. Transferred from [facility] with a L intraparenchymal ICH.  Left parietal high convexity ICH with focal SAH - traumatic vs. CAA  Neurosurgical consult - no intervention warranted  Resultant Global aphasia, receptive > expressive  MRI Cancelled due to intolerance  Repeat CT left posterior cerebral hematoma with increased full ness. Subarachnoid. question amyloid angiopathy.  Plan repeat CT  head in 4-6 weeks to look for underlying reason for hemorrhage  2D Echo EF 55-60%  No antithrombotic prior to admission, no antithrombotics this admission due to ICH  Ongoing aggressive stroke risk factor management  Therapy recommendations:no PT  Disposition: return home (lives at home alone, nephew who lives next door checks on him/cares for him)  Hx of ICH  05/2010 spontaneous L temporal ICH proceeded by word finding difficulties x 2 days, presume to be hypertensive etiology. Admitted by Dr. Jordan LikesPool.  Language deficit since then as per family  MRI not able to do to confirm or rule out CAA this time  ? Hypertension  BP 120/71 on arrival  BP remains stable  No BP meds so far  Stopped taking Metoprolol 50 daily in July of last year, 2015 (not taking any of his meds per family). Will reorder at time of discharge  Other Stroke Risk Factors  Advanced age  Former Cigarette smoker, quit smoking 32 years ago   Hx stroke/TIA - ICH as above  Other Active Problems  Baseline dementia  Thyroid disease - stopped taking synthroid in July of last year, 2015 (not taking  any of his meds per family). Will reorder at time of discharge    DISCHARGE EXAM Blood pressure 105/65, pulse 72, temperature 97.7 F (36.5 C), temperature source Axillary, resp. rate 18, height 5\' 10"  (1.778 m), weight 78.7 kg (173 lb 8 oz), SpO2 98 %. General - Well nourished, well developed, in no apparent distress.  Ophthalmologic - Fundi not visualized due to noncooperation.  Cardiovascular - Regular rate and rhythm.  Neuro -  Awake alert, know his name, But not orientated to age, time, people or situation.  Global aphasia, receptive > expressive, not following commands, not able to name, but able to repeat simple sentence. PERRL, EOMI, attending to both directions, no neglect, not consistent blinking to visual threat bilaterally, facial symmetry, tongue in midline, move all extremities equally and symmetric. Responding to pain stimulation bilaterally, DTR 1+ and no babinski. Gait and coordination not able to test   Discharge Diet   DIET SOFT Room service appropriate?: Yes; Fluid consistency:: Thin liquids  DISCHARGE PLAN  Disposition:  home   Due to hemorrhage and risk of bleeding, do not take aspirin, aspirin-containing medications, or ibuprofen products   Follow-up Deatra Canterxford, Walter J, FNP in 2 weeks.  Follow-up with Dr. Marvel PlanJindong Guillermo [name], Stroke Clinic in 2 months.  35 minutes were spent preparing discharge.  Walter [name],Walter [name]  [facility] See Amion for Pager information 09/14/2015 11:52 AM    I, the attending vascular neurologist, have personally obtained a history, examined the patient, evaluated laboratory data, individually viewed imaging studies and agree with radiology interpretations. I obtained additional history from pt's nephew at bedside. I also discussed with the nephew regarding his care plan. Together with the NP/PA, we formulated the assessment and plan of care which reflects our mutual decision.  I have made any additions or clarifications directly to  the above note and agree with the findings and plan as currently documented.    Marvel PlanJindong Ashtan Laton, MD PhD Stroke Neurology 09/18/2015 4:41 PM

## 2015-09-14 NOTE — Progress Notes (Signed)
STROKE TEAM PROGRESS NOTE   SUBJECTIVE (INTERVAL HISTORY) His nephew is at the bedside. Reports pt is a night owl - goes to bed around 3 a and sleeps until 2p daily. Nephew is see him 6 days/week (bascially lives next door). He has not driven within the past 2 years due to "medical reasons". He lives alone. He thinks that pt is now at his baseline.    OBJECTIVE Temp:  [98.2 F (36.8 C)-100.2 F (37.9 C)] 99.5 F (37.5 C) (11/24 0539) Pulse Rate:  [65-104] 79 (11/24 0539) Cardiac Rhythm:  [-] Heart block (11/24 0700) Resp:  [14-21] 16 (11/24 0539) BP: (123-177)/(51-91) 157/80 mmHg (11/24 0539) SpO2:  [93 %-99 %] 99 % (11/24 0539)  CBC:   Recent Labs Lab 09/13/15 0136 09/14/15 0446  WBC 8.5 7.9  HGB 12.5* 12.5*  HCT 38.5* 37.8*  MCV 90.8 89.6  PLT 164 181    Basic Metabolic Panel:   Recent Labs Lab 09/13/15 0136 09/14/15 0446  NA 139 135  K 4.9 4.2  CL 104 101  CO2 25 25  GLUCOSE 110* 110*  BUN 20 19  CREATININE 1.25* 1.35*  CALCIUM 9.6 9.4    Lipid Panel:     Component Value Date/Time   CHOL 286* 09/14/2015 0446   TRIG 103 09/14/2015 0446   HDL 45 09/14/2015 0446   CHOLHDL 6.4 09/14/2015 0446   VLDL 21 09/14/2015 0446   LDLCALC 220* 09/14/2015 0446   IMAGING I have personally reviewed the radiological images below and agree with the radiology interpretations.  CT head And neck without contrast [name] 1. acute 3.3 cm ICH in the  High left parietal lobe with surrounding SAH along the left parietal lobe and posterior aspect of the left frontal lobe near the vertex. No midline shift. 2. Chronic ischemic microvascular white matter disease. 3. Old encephalomalacia in the left temporal parietal region 4. Left supraorbital soft tissue swelling 5. Cervical spondylosis and degenerative disc disease with osseous foraminal stenosis at several levels, but no acute fracture or acute subluxation.   CT head repeat  09/13/2015  1. Increased fullness of a  ~ 4 cm left posterior cerebral hematoma compared to yesterday, as described above, without increased mass effect or shift. Regional subarachnoid extension remains small volume. 2. Left temporal gliosis from previous lobar hemorrhage, question amyloid angiopathy.   Dg Chest 1 View 09/13/2015  Chronic interstitial changes without acute abnormality.   2-D echo - Left ventricle: The cavity size was normal. Wall thickness wasincreased in a pattern of mild LVH. Systolic function was normal.The estimated ejection fraction was in the range of 55% to 60%.Wall motion was normal; there were no regional wall motionabnormalities. - Aortic valve: There was trivial regurgitation. - Mitral valve: There was mild regurgitation. - Atrial septum: No defect or patent foramen ovale was identified.   PHYSICAL EXAM General - Well nourished, well developed, in no apparent distress.  Ophthalmologic - Fundi not visualized due to  noncooperation.  Cardiovascular - Regular rate and rhythm.  Neuro -  Awake alert, know his name,  But not orientated to age, time, people or situation.  Global aphasia, receptive > expressive, not following commands, not able to name, but able to repeat simple sentence. PERRL, EOMI, attending to both directions, no neglect, not consistent blinking to visual threat bilaterally, facial symmetry, tongue in midline, move all extremities equally and symmetric. Responding to pain stimulation bilaterally, DTR 1+ and no babinski. Gait and coordination not able to test  ASSESSMENT/PLAN [name] is a 81 y.o. male with history of hypertension and previous ICH presenting following a fall with generalized weakness. Transferred from [facility] with a L intraparenchymal ICH.  Left parietal high convexity ICH with focal SAH - traumatic vs. CAA  Neurosurgical consult - no intervention warranted  Resultant  Global aphasia, receptive > expressive  MRI  Cancelled due to  intolerance  Repeat CT  left posterior cerebral hematoma with increased full ness. Subarachnoid. question amyloid angiopathy.   2D Echo  EF 55-60%  SCDs for VTE prophylaxis DIET SOFT Room service appropriate?: Yes; Fluid consistency:: Thin  No antithrombotic prior to admission, no antithrombotics this admission due to ICH. Due to concerns of CAA, would like to have no antithrombotics in the future.  Ongoing aggressive stroke risk factor management  Cancel sitter  Therapy recommendations:  Pending. Ok to be OOB  Disposition:  pending (lives at home alone, nephew who lives next door checks on him/cares for him)  Probably best for him to return to his own environment, will have PT see first - anticipate d/c later today.  Plan repeat CT head in 4-6 weeks  Hx of ICH  05/2010 spontaneous L temporal ICH proceeded by word finding difficulties x 2 days, presume to be hypertensive etiology. Admitted by Dr. Jordan Likes.  Language deficit since then as per family  MRI not able to do to confirm or rule out CAA this time  No antithrombotics in the future  ? Hypertension  BP 120/71 on arrival  BP remains stable  No BP meds so far  Home meds:  Metoprolol 50 daily  Other Stroke Risk Factors  Advanced age  Former Cigarette smoker, quit smoking 32 years ago   Hx stroke/TIA -   Other Active Problems  Thyroid disease  Hospital day # 2  Marvel Plan, MD PhD Stroke Neurology 09/14/2015 12:27 PM   To contact Stroke Continuity provider, please refer to [email or web address]. After hours, contact General Neurology

## 2015-09-14 NOTE — Evaluation (Signed)
Physical Therapy Evaluation Patient Details Name: Walter FastRobert J [name] MRN: [id number] DOB: [birth year] Today's Date: 09/14/2015   History of Present Illness  Patient is an 81 yo male admitted 09/12/15 after a fall with Lt ICH.   PMH:  HTN, ICH, CVA with aphasia, AMS  Clinical Impression  Patient able to ambulate 1360' with min assist.  Per nephew, patient is at his baseline for functional mobility.  Reports he and 2 other family members will be providing 24 hour assist for patient at home.  Patient ready for d/c from PT perspective.    Follow Up Recommendations No PT follow up;Supervision/Assistance - 24 hour;Supervision for mobility/OOB    Equipment Recommendations  None recommended by PT    Recommendations for Other Services       Precautions / Restrictions Precautions Precautions: Fall Precaution Comments: Admitted following fall Restrictions Weight Bearing Restrictions: No      Mobility  Bed Mobility                  Transfers Overall transfer level: Needs assistance Equipment used: Rolling walker (2 wheeled) Transfers: Sit to/from Stand Sit to Stand: Max assist         General transfer comment: Verbal and tactile cues for hand placement.  Max assist to rise to standing from chair.  Posterior lean initially.  Was able to shift weight forward and maintain balance with min - min guard assist.  Ambulation/Gait Ambulation/Gait assistance: Mod assist;Min assist Ambulation Distance (Feet): 60 Feet Assistive device: Rolling walker (2 wheeled) Gait Pattern/deviations: Step-through pattern;Decreased step length - right;Decreased step length - left;Decreased stride length;Shuffle;Trunk flexed Gait velocity: decreased Gait velocity interpretation: Below normal speed for age/gender General Gait Details: Physical assist to maneuver RW expecially during turns.  Patient initially with posterior lean, requiring mod assist for balance.  After several steps, patient requiring  min/min guard assist for balance with use of RW.  Patient with shuffling steps and flexed posture.  Stairs            Wheelchair Mobility    Modified Rankin (Stroke Patients Only)       Balance Overall balance assessment: Needs assistance;History of Falls         Standing balance support: Bilateral upper extremity supported Standing balance-Leahy Scale: Poor                               Pertinent Vitals/Pain Pain Assessment: Faces Faces Pain Scale: No hurt    Home Living Family/patient expects to be discharged to:: Private residence Living Arrangements: Alone Available Help at Discharge: Family;Available 24 hours/day (Nephew is primary caregiver.) Type of Home: House Home Access: Stairs to enter Entrance Stairs-Rails: Doctor, general practiceight;Left Entrance Stairs-Number of Steps: 4 Home Layout: One level Home Equipment: Walker - 2 wheels;Cane - single point;Bedside commode;Shower seat      Prior Function Level of Independence: Independent;Needs assistance   Gait / Transfers Assistance Needed: Was not using assistive device for gait pta.  ADL's / Homemaking Assistance Needed: Nephew checked on patient.  Family assist with meals, etc.        Hand Dominance        Extremity/Trunk Assessment   Upper Extremity Assessment: Generalized weakness           Lower Extremity Assessment: Generalized weakness      Cervical / Trunk Assessment: Kyphotic  Communication   Communication: Receptive difficulties;Expressive difficulties (Aphasia from prior stroke)  Cognition Arousal/Alertness: Lethargic Behavior During  Therapy: Flat affect Overall Cognitive Status: History of cognitive impairments - at baseline                      General Comments      Exercises        Assessment/Plan    PT Assessment Patent does not need any further PT services  PT Diagnosis Difficulty walking;Abnormality of gait;Generalized weakness;Altered mental status   PT  Problem List    PT Treatment Interventions     PT Goals (Current goals can be found in the Care Plan section) Acute Rehab PT Goals Patient Stated Goal: Nephew - to go home PT Goal Formulation: All assessment and education complete, DC therapy    Frequency     Barriers to discharge        Co-evaluation               End of Session Equipment Utilized During Treatment: Gait belt Activity Tolerance: Patient tolerated treatment well Patient left: in bed;with call bell/phone within reach;with nursing/sitter in room;with family/visitor present (sitting EOB) Nurse Communication: Mobility status (Per family, patient at baseline.  Ready for d/c)         Time: 1442-1510 PT Time Calculation (min) (ACUTE ONLY): 28 min   Charges:   PT Evaluation $Initial PT Evaluation Tier I: 1 Procedure PT Treatments $Gait Training: 8-22 mins   PT G Codes:        Vena Austria Oct 02, 2015, 3:34 PM Durenda Hurt. Renaldo Fiddler, [facility] Acute Rehab Services Pager [phone]

## 2015-09-14 NOTE — Progress Notes (Signed)
All discharge instructions including current medications, follow-up appointments, community resources, myChart, emergency signs and symptoms in which to seek immediate medical attention were reviewed, discussed and sent home with patient and his caregiver. Patient and caregiver instructed not to take any asprin containing products or Ibuprophen. Patient and caregiver (nephew) verbalized understanding.

## 2015-09-14 NOTE — Progress Notes (Signed)
Upon assessment, patient noted picking at restraints, attempting to climb out of bed, refusing to speak with staff, aggitated. Soft restraints discontinued to monitor patient. Patient repositioned, now resting quietly without any restraint. Will continue to monitor closely.

## 2015-09-15 LAB — HEMOGLOBIN A1C
HEMOGLOBIN A1C: 6 % — AB (ref 4.8–5.6)
Mean Plasma Glucose: 126 mg/dL

## 2015-10-07 ENCOUNTER — Emergency Department (HOSPITAL_COMMUNITY): Payer: Medicare Other

## 2015-10-07 ENCOUNTER — Inpatient Hospital Stay (HOSPITAL_COMMUNITY): Payer: Medicare Other

## 2015-10-07 ENCOUNTER — Encounter (HOSPITAL_COMMUNITY): Payer: Self-pay

## 2015-10-07 ENCOUNTER — Inpatient Hospital Stay (HOSPITAL_COMMUNITY)
Admission: EM | Admit: 2015-10-07 | Discharge: 2015-10-22 | DRG: 064 | Disposition: E | Payer: Medicare Other | Attending: Neurology | Admitting: Neurology

## 2015-10-07 DIAGNOSIS — I680 Cerebral amyloid angiopathy: Secondary | ICD-10-CM | POA: Diagnosis present

## 2015-10-07 DIAGNOSIS — Z4659 Encounter for fitting and adjustment of other gastrointestinal appliance and device: Secondary | ICD-10-CM

## 2015-10-07 DIAGNOSIS — I615 Nontraumatic intracerebral hemorrhage, intraventricular: Secondary | ICD-10-CM | POA: Diagnosis present

## 2015-10-07 DIAGNOSIS — I609 Nontraumatic subarachnoid hemorrhage, unspecified: Secondary | ICD-10-CM | POA: Diagnosis present

## 2015-10-07 DIAGNOSIS — Z09 Encounter for follow-up examination after completed treatment for conditions other than malignant neoplasm: Secondary | ICD-10-CM

## 2015-10-07 DIAGNOSIS — D649 Anemia, unspecified: Secondary | ICD-10-CM | POA: Diagnosis present

## 2015-10-07 DIAGNOSIS — Z955 Presence of coronary angioplasty implant and graft: Secondary | ICD-10-CM | POA: Diagnosis not present

## 2015-10-07 DIAGNOSIS — Z880 Allergy status to penicillin: Secondary | ICD-10-CM | POA: Diagnosis not present

## 2015-10-07 DIAGNOSIS — R4701 Aphasia: Secondary | ICD-10-CM | POA: Diagnosis present

## 2015-10-07 DIAGNOSIS — I639 Cerebral infarction, unspecified: Secondary | ICD-10-CM

## 2015-10-07 DIAGNOSIS — I10 Essential (primary) hypertension: Secondary | ICD-10-CM | POA: Diagnosis present

## 2015-10-07 DIAGNOSIS — G935 Compression of brain: Secondary | ICD-10-CM | POA: Diagnosis present

## 2015-10-07 DIAGNOSIS — E039 Hypothyroidism, unspecified: Secondary | ICD-10-CM | POA: Diagnosis present

## 2015-10-07 DIAGNOSIS — G936 Cerebral edema: Secondary | ICD-10-CM | POA: Diagnosis present

## 2015-10-07 DIAGNOSIS — J9690 Respiratory failure, unspecified, unspecified whether with hypoxia or hypercapnia: Secondary | ICD-10-CM | POA: Diagnosis present

## 2015-10-07 DIAGNOSIS — Z87891 Personal history of nicotine dependence: Secondary | ICD-10-CM

## 2015-10-07 DIAGNOSIS — G8191 Hemiplegia, unspecified affecting right dominant side: Secondary | ICD-10-CM | POA: Diagnosis present

## 2015-10-07 DIAGNOSIS — R29810 Facial weakness: Secondary | ICD-10-CM | POA: Diagnosis present

## 2015-10-07 DIAGNOSIS — I611 Nontraumatic intracerebral hemorrhage in hemisphere, cortical: Secondary | ICD-10-CM | POA: Diagnosis not present

## 2015-10-07 DIAGNOSIS — G919 Hydrocephalus, unspecified: Secondary | ICD-10-CM | POA: Diagnosis present

## 2015-10-07 DIAGNOSIS — E854 Organ-limited amyloidosis: Secondary | ICD-10-CM | POA: Diagnosis present

## 2015-10-07 DIAGNOSIS — Z66 Do not resuscitate: Secondary | ICD-10-CM | POA: Diagnosis present

## 2015-10-07 DIAGNOSIS — Z515 Encounter for palliative care: Secondary | ICD-10-CM | POA: Diagnosis present

## 2015-10-07 DIAGNOSIS — Z23 Encounter for immunization: Secondary | ICD-10-CM | POA: Diagnosis not present

## 2015-10-07 DIAGNOSIS — Z8673 Personal history of transient ischemic attack (TIA), and cerebral infarction without residual deficits: Secondary | ICD-10-CM

## 2015-10-07 DIAGNOSIS — I619 Nontraumatic intracerebral hemorrhage, unspecified: Secondary | ICD-10-CM | POA: Diagnosis present

## 2015-10-07 DIAGNOSIS — I612 Nontraumatic intracerebral hemorrhage in hemisphere, unspecified: Secondary | ICD-10-CM | POA: Diagnosis not present

## 2015-10-07 DIAGNOSIS — Z7189 Other specified counseling: Secondary | ICD-10-CM

## 2015-10-07 DIAGNOSIS — G9340 Encephalopathy, unspecified: Secondary | ICD-10-CM | POA: Diagnosis present

## 2015-10-07 LAB — CBC
HCT: 36 % — ABNORMAL LOW (ref 39.0–52.0)
Hemoglobin: 11.6 g/dL — ABNORMAL LOW (ref 13.0–17.0)
MCH: 29 pg (ref 26.0–34.0)
MCHC: 32.2 g/dL (ref 30.0–36.0)
MCV: 90 fL (ref 78.0–100.0)
PLATELETS: 178 10*3/uL (ref 150–400)
RBC: 4 MIL/uL — ABNORMAL LOW (ref 4.22–5.81)
RDW: 13.5 % (ref 11.5–15.5)
WBC: 9.9 10*3/uL (ref 4.0–10.5)

## 2015-10-07 LAB — SODIUM
Sodium: 136 mmol/L (ref 135–145)
Sodium: 137 mmol/L (ref 135–145)
Sodium: 138 mmol/L (ref 135–145)
Sodium: 139 mmol/L (ref 135–145)

## 2015-10-07 LAB — RAPID URINE DRUG SCREEN, HOSP PERFORMED
AMPHETAMINES: NOT DETECTED
BENZODIAZEPINES: NOT DETECTED
Barbiturates: NOT DETECTED
Cocaine: NOT DETECTED
OPIATES: NOT DETECTED
Tetrahydrocannabinol: NOT DETECTED

## 2015-10-07 LAB — I-STAT CHEM 8, ED
BUN: 30 mg/dL — ABNORMAL HIGH (ref 6–20)
CALCIUM ION: 1.05 mmol/L — AB (ref 1.13–1.30)
CHLORIDE: 104 mmol/L (ref 101–111)
Creatinine, Ser: 1.2 mg/dL (ref 0.61–1.24)
Glucose, Bld: 162 mg/dL — ABNORMAL HIGH (ref 65–99)
HCT: 36 % — ABNORMAL LOW (ref 39.0–52.0)
Hemoglobin: 12.2 g/dL — ABNORMAL LOW (ref 13.0–17.0)
Potassium: 4.3 mmol/L (ref 3.5–5.1)
SODIUM: 139 mmol/L (ref 135–145)
TCO2: 25 mmol/L (ref 0–100)

## 2015-10-07 LAB — URINALYSIS, ROUTINE W REFLEX MICROSCOPIC
BILIRUBIN URINE: NEGATIVE
Glucose, UA: NEGATIVE mg/dL
Hgb urine dipstick: NEGATIVE
Ketones, ur: NEGATIVE mg/dL
Leukocytes, UA: NEGATIVE
NITRITE: NEGATIVE
PH: 7 (ref 5.0–8.0)
Protein, ur: NEGATIVE mg/dL
SPECIFIC GRAVITY, URINE: 1.012 (ref 1.005–1.030)

## 2015-10-07 LAB — COMPREHENSIVE METABOLIC PANEL
ALT: 13 U/L — ABNORMAL LOW (ref 17–63)
ANION GAP: 13 (ref 5–15)
AST: 21 U/L (ref 15–41)
Albumin: 3.6 g/dL (ref 3.5–5.0)
Alkaline Phosphatase: 77 U/L (ref 38–126)
BUN: 30 mg/dL — ABNORMAL HIGH (ref 6–20)
CALCIUM: 8.9 mg/dL (ref 8.9–10.3)
CHLORIDE: 103 mmol/L (ref 101–111)
CO2: 22 mmol/L (ref 22–32)
Creatinine, Ser: 1.37 mg/dL — ABNORMAL HIGH (ref 0.61–1.24)
GFR calc non Af Amer: 44 mL/min — ABNORMAL LOW (ref >60)
GFR, EST AFRICAN AMERICAN: 51 mL/min — AB (ref >60)
Glucose, Bld: 162 mg/dL — ABNORMAL HIGH (ref 65–99)
Potassium: 4.4 mmol/L (ref 3.5–5.1)
SODIUM: 138 mmol/L (ref 135–145)
Total Bilirubin: 0.7 mg/dL (ref 0.3–1.2)
Total Protein: 6.8 g/dL (ref 6.5–8.1)

## 2015-10-07 LAB — PROTIME-INR
INR: 1 (ref 0.00–1.49)
PROTHROMBIN TIME: 13.4 s (ref 11.6–15.2)

## 2015-10-07 LAB — CBG MONITORING, ED: GLUCOSE-CAPILLARY: 155 mg/dL — AB (ref 65–99)

## 2015-10-07 LAB — I-STAT TROPONIN, ED: Troponin i, poc: 0.01 ng/mL (ref 0.00–0.08)

## 2015-10-07 LAB — MRSA PCR SCREENING: MRSA by PCR: NEGATIVE

## 2015-10-07 LAB — DIFFERENTIAL
BASOS PCT: 0 %
Basophils Absolute: 0 10*3/uL (ref 0.0–0.1)
EOS ABS: 0 10*3/uL (ref 0.0–0.7)
EOS PCT: 0 %
Lymphocytes Relative: 10 %
Lymphs Abs: 1 10*3/uL (ref 0.7–4.0)
MONO ABS: 0.4 10*3/uL (ref 0.1–1.0)
Monocytes Relative: 4 %
Neutro Abs: 8.5 10*3/uL — ABNORMAL HIGH (ref 1.7–7.7)
Neutrophils Relative %: 86 %

## 2015-10-07 LAB — APTT: aPTT: 27 seconds (ref 24–37)

## 2015-10-07 LAB — ETHANOL

## 2015-10-07 MED ORDER — ACETAMINOPHEN 650 MG RE SUPP: 650.0000 mg | RECTAL | Status: DC | PRN

## 2015-10-07 MED ORDER — INFLUENZA VAC SPLIT QUAD 0.5 ML IM SUSY
0.5000 mL | PREFILLED_SYRINGE | INTRAMUSCULAR | Status: AC
  Administered 2015-10-08: 0.5 mL via INTRAMUSCULAR
  Filled 2015-10-07: qty 0.5

## 2015-10-07 MED ORDER — CHLORHEXIDINE GLUCONATE 0.12 % MT SOLN
15.0000 mL | Freq: Two times a day (BID) | OROMUCOSAL | Status: DC
  Administered 2015-10-07 – 2015-10-09 (×6): 15 mL via OROMUCOSAL
  Filled 2015-10-07 (×2): qty 15

## 2015-10-07 MED ORDER — SODIUM CHLORIDE 0.9 % IV SOLN
500.0000 mg | Freq: Two times a day (BID) | INTRAVENOUS | Status: DC
  Administered 2015-10-07 – 2015-10-09 (×6): 500 mg via INTRAVENOUS
  Filled 2015-10-07 (×8): qty 5

## 2015-10-07 MED ORDER — SENNOSIDES-DOCUSATE SODIUM 8.6-50 MG PO TABS
1.0000 | ORAL_TABLET | Freq: Two times a day (BID) | ORAL | Status: DC
Start: 2015-10-07 — End: 2015-10-10
  Administered 2015-10-07 – 2015-10-09 (×4): 1 via ORAL
  Filled 2015-10-07 (×4): qty 1

## 2015-10-07 MED ORDER — STROKE: EARLY STAGES OF RECOVERY BOOK
Freq: Once | Status: DC
  Filled 2015-10-07: qty 1

## 2015-10-07 MED ORDER — ACETAMINOPHEN 325 MG PO TABS: 650.0000 mg | ORAL_TABLET | ORAL | Status: DC | PRN

## 2015-10-07 MED ORDER — LABETALOL HCL 5 MG/ML IV SOLN
10.0000 mg | INTRAVENOUS | Status: DC | PRN
  Administered 2015-10-07 – 2015-10-09 (×6): 20 mg via INTRAVENOUS
  Administered 2015-10-10: 40 mg via INTRAVENOUS
  Filled 2015-10-07 (×6): qty 4
  Filled 2015-10-07 (×2): qty 8
  Filled 2015-10-07 (×2): qty 4

## 2015-10-07 MED ORDER — HYDRALAZINE HCL 20 MG/ML IJ SOLN
10.0000 mg | Freq: Four times a day (QID) | INTRAMUSCULAR | Status: DC | PRN
Start: 2015-10-07 — End: 2015-10-10
  Administered 2015-10-07 – 2015-10-10 (×3): 10 mg via INTRAVENOUS
  Filled 2015-10-07 (×3): qty 1

## 2015-10-07 MED ORDER — PANTOPRAZOLE SODIUM 40 MG IV SOLR
40.0000 mg | Freq: Every day | INTRAVENOUS | Status: DC
  Administered 2015-10-07 – 2015-10-09 (×3): 40 mg via INTRAVENOUS
  Filled 2015-10-07 (×3): qty 40

## 2015-10-07 MED ORDER — CETYLPYRIDINIUM CHLORIDE 0.05 % MT LIQD
7.0000 mL | Freq: Two times a day (BID) | OROMUCOSAL | Status: DC
  Administered 2015-10-07 – 2015-10-09 (×6): 7 mL via OROMUCOSAL

## 2015-10-07 MED ORDER — PNEUMOCOCCAL VAC POLYVALENT 25 MCG/0.5ML IJ INJ
0.5000 mL | INJECTION | INTRAMUSCULAR | Status: AC
Start: 2015-10-08 — End: 2015-10-08
  Administered 2015-10-08: 0.5 mL via INTRAMUSCULAR
  Filled 2015-10-07: qty 0.5

## 2015-10-07 MED ORDER — SODIUM CHLORIDE 3 % IV SOLN
INTRAVENOUS | Status: DC
  Administered 2015-10-07: 30 mL/h via INTRAVENOUS
  Administered 2015-10-08: 50 mL/h via INTRAVENOUS
  Administered 2015-10-08 – 2015-10-10 (×4): 40 mL/h via INTRAVENOUS
  Filled 2015-10-07 (×9): qty 500

## 2015-10-07 NOTE — Procedures (Signed)
Central Venous Catheter Insertion Procedure Note Walter FastRobert J Ridley [id number] 09-30-1925  Procedure: Insertion of Central Venous Catheter Indications: Drug and/or fluid administration  Procedure Details Consent: Risks of procedure as well as the alternatives and risks of each were explained to the (patient/caregiver).  Consent for procedure obtained. Time Out: Verified patient identification, verified procedure, site/side was marked, verified correct patient position, special equipment/implants available, medications/allergies/relevent history reviewed, required imaging and test results available.  Performed  Maximum sterile technique was used including antiseptics, cap, gloves, gown, hand hygiene, mask and sheet. Skin prep: Chlorhexidine; local anesthetic administered A antimicrobial bonded/coated triple lumen catheter was placed in the left internal jugular vein using the Seldinger technique.  Evaluation Blood flow good Complications: No apparent complications Patient did tolerate procedure well. Chest X-ray ordered to verify placement.  CXR: pending.   Performed using ultrasound guidance.  Wire visualized in vessel under ultrasound.   Dirk DressKaty Whiteheart, NP 10/03/2015  12:33 PM

## 2015-10-07 NOTE — ED Notes (Signed)
Pt arrived via [facility] from home for stroke like symptoms.  Pt's last known normal was before he went to bed.  Family at bedside thinks around 0015, new symptoms noticed around 0445.  Pt has had previous stroke in 2011 without any physical deficits.  Family reports pt fell Tuesday before Thanksgiving and reports pt has a small brain bleed.

## 2015-10-07 NOTE — ED Provider Notes (Signed)
CSN: [id number]     Arrival date & time January 06, 2015  0549 History   First MD Initiated Contact with Patient January 06, 2015 [phone]     Chief Complaint  Patient presents with  . Cerebrovascular Accident     (Consider location/radiation/quality/duration/timing/severity/associated sxs/prior Treatment) HPI  Walter [name] is an 81yo male, PMH of hem stroke, presenting today with stroke symptoms.  History obtained from son in the room who states patient was last seen normal last night around midnight.  He heard the patient fall and noticed deficits that were similar to his last stroke including left sided weakness, confusion and aphasia.  He denies that patient feeling sick or different recently.  There are no further complaints.     Past Medical History  Diagnosis Date  . Hypertension   . Thyroid disease     hypothyroidism  . Stroke [facility]) 2010   Past Surgical History  Procedure Laterality Date  . Coronary stent placement     History reviewed. No pertinent family history. Social History  Substance Use Topics  . Smoking status: Former Smoker    Quit date: 06/19/1983  . Smokeless tobacco: Never Used  . Alcohol Use: No    Review of Systems  Unable to perform ROS: Mental status change      Allergies  Penicillins  Home Medications   Prior to Admission medications   Medication Sig Start Date End Date Taking? Authorizing Provider  metoprolol succinate (TOPROL XL) 50 MG 24 hr tablet Take 1 tablet (50 mg total) by mouth daily. Take with or immediately following a meal. Patient not taking: Reported on 09/13/2015 10/20/13   Deatra CanterWilliam J Oxford, FNP   BP 115/96 mmHg  Pulse 88  Temp(Src) 98.1 F (36.7 C) (Oral)  Resp 18  Ht 5\' 10"  (1.778 m)  Wt 165 lb (74.844 kg)  BMI 23.68 kg/m2  SpO2 97% Physical Exam  Constitutional: Vital signs are normal. He appears well-developed and well-nourished.  Non-toxic appearance. He does not appear ill. No distress.  HENT:  Head: Normocephalic and  atraumatic.  Nose: Nose normal.  Mouth/Throat: Oropharynx is clear and moist. No oropharyngeal exudate.  Eyes: Conjunctivae and EOM are normal. Pupils are equal, round, and reactive to light. No scleral icterus.  Neck: Normal range of motion. Neck supple. No tracheal deviation, no edema, no erythema and normal range of motion present. No thyroid mass and no thyromegaly present.  Cardiovascular: Normal rate, regular rhythm, S1 normal, S2 normal, normal heart sounds, intact distal pulses and normal pulses.  Exam reveals no gallop and no friction rub.   No murmur heard. Pulmonary/Chest: Effort normal and breath sounds normal. No respiratory distress. He has no wheezes. He has no rhonchi. He has no rales.  Abdominal: Soft. Normal appearance and bowel sounds are normal. He exhibits no distension, no ascites and no mass. There is no hepatosplenomegaly. There is no tenderness. There is no rebound, no guarding and no CVA tenderness.  Musculoskeletal: Normal range of motion. He exhibits no edema or tenderness.  Lymphadenopathy:    He has no cervical adenopathy.  Neurological: He is alert. He has normal strength. No sensory deficit.  Patient moves all 4 extremities but does not follow any commands. He has aphasia  Skin: Skin is warm, dry and intact. No petechiae and no rash noted. He is not diaphoretic. No erythema. No pallor.  Nursing note and vitals reviewed.   ED Course  Procedures (including critical care time) Labs Review Labs Reviewed  CBC - Abnormal; Notable for  the following:    RBC 4.00 (*)    Hemoglobin 11.6 (*)    HCT 36.0 (*)    All other components within normal limits  DIFFERENTIAL - Abnormal; Notable for the following:    Neutro Abs 8.5 (*)    All other components within normal limits  CBG MONITORING, ED - Abnormal; Notable for the following:    Glucose-Capillary 155 (*)    All other components within normal limits  I-STAT CHEM 8, ED - Abnormal; Notable for the following:     BUN 30 (*)    Glucose, Bld 162 (*)    Calcium, Ion 1.05 (*)    Hemoglobin 12.2 (*)    HCT 36.0 (*)    All other components within normal limits  ETHANOL  PROTIME-INR  APTT  COMPREHENSIVE METABOLIC PANEL  URINE RAPID DRUG SCREEN, HOSP PERFORMED  URINALYSIS, ROUTINE W REFLEX MICROSCOPIC (NOT AT [facility])  I-STAT TROPOININ, ED    Imaging Review Ct Head Wo Contrast  10/08/2015  CLINICAL DATA:  Code stroke. Sudden onset aphasia. Less responsive. EXAM: CT HEAD WITHOUT CONTRAST TECHNIQUE: Contiguous axial images were obtained from the base of the skull through the vertex without intravenous contrast. COMPARISON:  09/13/2015 FINDINGS: Increasing large left posterior frontal, parietal, and temporal intraparenchymal hematoma. Largest portion of the hematoma measures about 4.8 x 4.2 cm in axial dimension. Additional hematoma centered in the temporal region measures about 3.2 cm in diameter. There is associated mass effect with sulcal effacement and about 5 mm left-to-right midline shift. Mass effect is increasing since previous study. There is bilateral intraventricular hemorrhage. Suggestion of a small amount of hemorrhage in the frontal sulci. There is associated white matter edema in the left cerebral hemisphere. Diffuse underlying cortical atrophy. Ventricular dilatation is most consistent with central atrophy. Low-attenuation changes throughout the deep white matter consistent with small vessel ischemia. No abnormal extra-axial fluid collections. Calvarium appears intact. Visualized paranasal sinuses and mastoid air cells are not opacified. Vascular calcifications. IMPRESSION: Progression and increase of intraparenchymal hemorrhage in the left with extension into the lateral ventricles bilaterally and small amount of subarachnoid hemorrhage in the sulci. Increasing white matter edema. 5 mm left-to-right midline shift with effacement of sulci on the left. These results were called by telephone at the time of  interpretation on 10/04/2015 at 6:42 am to Dr. Ritta Slot , who verbally acknowledged these results. Electronically Signed   By: Burman Nieves M.D.   On: 10/20/2015 06:47   I have personally reviewed and evaluated these images and lab results as part of my medical decision-making.   EKG Interpretation   Date/Time:  Saturday October 07 2015 05:56:33 EST Ventricular Rate:  85 PR Interval:  226 QRS Duration: 97 QT Interval:  381 QTC Calculation: 453 R Axis:   -15 Text Interpretation:  Sinus rhythm Prolonged PR interval Left ventricular  hypertrophy No significant change since last tracing Confirmed by Erroll Luna [phone]) on 10/16/2015 6:49:29 AM      MDM   Final diagnoses:  Hemorrhagic stroke [facility]   Patient presents to the ED for AMS and stroke like symptoms at home.  Initially last seen  Normal per EMS was yesterday, however upon questioning of the family, son states it occurred at midnight.  It is currently 6:01am and patient is still in the 8 hour window.  Code stroke was immediately called.   CT scan reveals hemorrhagic stroke.  Dr. Amada Jupiter has evaluated the patient and patient will require admission for further  care.      CRITICAL CARE Performed by: Tomasita Crumble   Total critical care time: 45 minutes - hemorrhagic stroke  Critical care time was exclusive of separately billable procedures and treating other patients.  Critical care was necessary to treat or prevent imminent or life-threatening deterioration.  Critical care was time spent personally by me on the following activities: development of treatment plan with patient and/or surrogate as well as nursing, discussions with consultants, evaluation of patient's response to treatment, examination of patient, obtaining history from patient or surrogate, ordering and performing treatments and interventions, ordering and review of laboratory studies, ordering and review of radiographic studies, pulse  oximetry and re-evaluation of patient's condition.   Tomasita Crumble, MD 10/06/2015 [phone]

## 2015-10-07 NOTE — Progress Notes (Addendum)
Stroke Team Progress Note  HISTORY 81 y.o. male who was last seen well around midnight. Subsequently his family heard a big thump around 445 and the patient was discovered to be aphasic and hemiplegic on the right. Of note, history/admitted for intracranial hemorrhage with concern for possible cerebral amyloid. There have been plans to repeat imaging after resolution of the hemorrhage, but this has not occurred as of yet.  SUBJECTIVE Somnolent, does not follow commands.    OBJECTIVE Most recent Vital Signs: Filed Vitals:   10/01/2015 0837 10/01/2015 0845 09/22/2015 0900 10/04/2015 0922  BP:  192/92 176/80 168/65  Pulse:  92 78 65  Temp: 98.9 F (37.2 C)     TempSrc: Oral     Resp:  19 22 11   Height:      Weight:      SpO2:  96% 98% 96%   CBG (last 3)   Recent Labs  09/26/2015 0559  GLUCAP 155*    IV Fluid Intake:   . sodium chloride (hypertonic)      MEDICATIONS  .  stroke: mapping our early stages of recovery book   Does not apply Once  . antiseptic oral rinse  7 mL Mouth Rinse q12n4p  . chlorhexidine  15 mL Mouth Rinse BID  . levETIRAcetam  500 mg Intravenous Q12H  . pantoprazole (PROTONIX) IV  40 mg Intravenous QHS  . senna-docusate  1 tablet Oral BID   PRN:  acetaminophen **OR** acetaminophen, hydrALAZINE, labetalol  Diet:  Diet NPO time specified Activity:  Bedrest DVT Prophylaxis:  SCDs  CLINICALLY SIGNIFICANT STUDIES Basic Metabolic Panel:  Recent Labs Lab 10/10/2015 0624 10/14/2015 0630  NA 138 139  K 4.4 4.3  CL 103 104  CO2 22  --   GLUCOSE 162* 162*  BUN 30* 30*  CREATININE 1.37* 1.20  CALCIUM 8.9  --    Liver Function Tests:  Recent Labs Lab 10/19/2015 0624  AST 21  ALT 13*  ALKPHOS 77  BILITOT 0.7  PROT 6.8  ALBUMIN 3.6   CBC:  Recent Labs Lab 10/17/2015 0624 10/02/2015 0630  WBC 9.9  --   NEUTROABS 8.5*  --   HGB 11.6* 12.2*  HCT 36.0* 36.0*  MCV 90.0  --   PLT 178  --    Coagulation:  Recent Labs Lab 10/17/2015 0624  LABPROT 13.4  INR  1.00   Cardiac Enzymes: No results for input(s): CKTOTAL, CKMB, CKMBINDEX, TROPONINI in the last 168 hours. Urinalysis:  Recent Labs Lab 10/18/2015 0659  COLORURINE YELLOW  LABSPEC 1.012  PHURINE 7.0  GLUCOSEU NEGATIVE  HGBUR NEGATIVE  BILIRUBINUR NEGATIVE  KETONESUR NEGATIVE  PROTEINUR NEGATIVE  NITRITE NEGATIVE  LEUKOCYTESUR NEGATIVE   Lipid Panel    Component Value Date/Time   CHOL 286* 09/14/2015 0446   TRIG 103 09/14/2015 0446   HDL 45 09/14/2015 0446   CHOLHDL 6.4 09/14/2015 0446   VLDL 21 09/14/2015 0446   LDLCALC 220* 09/14/2015 0446   HgbA1C  Lab Results  Component Value Date   HGBA1C 6.0* 09/14/2015    Urine Drug Screen:     Component Value Date/Time   LABOPIA NONE DETECTED 09/21/2015 0659   COCAINSCRNUR NONE DETECTED 10/09/2015 0659   LABBENZ NONE DETECTED 10/04/2015 0659   AMPHETMU NONE DETECTED 10/19/2015 0659   THCU NONE DETECTED 10/08/2015 0659   LABBARB NONE DETECTED 10/01/2015 0659    Alcohol Level:  Recent Labs Lab 09/22/2015 0624  ETH <5    Ct Head Wo Contrast  10/14/2015  CLINICAL DATA:  Code stroke. Sudden onset aphasia. Less responsive. EXAM: CT HEAD WITHOUT CONTRAST TECHNIQUE: Contiguous axial images were obtained from the base of the skull through the vertex without intravenous contrast. COMPARISON:  09/13/2015 FINDINGS: Increasing large left posterior frontal, parietal, and temporal intraparenchymal hematoma. Largest portion of the hematoma measures about 4.8 x 4.2 cm in axial dimension. Additional hematoma centered in the temporal region measures about 3.2 cm in diameter. There is associated mass effect with sulcal effacement and about 5 mm left-to-right midline shift. Mass effect is increasing since previous study. There is bilateral intraventricular hemorrhage. Suggestion of a small amount of hemorrhage in the frontal sulci. There is associated white matter edema in the left cerebral hemisphere. Diffuse underlying cortical atrophy.  Ventricular dilatation is most consistent with central atrophy. Low-attenuation changes throughout the deep white matter consistent with small vessel ischemia. No abnormal extra-axial fluid collections. Calvarium appears intact. Visualized paranasal sinuses and mastoid air cells are not opacified. Vascular calcifications. IMPRESSION: Progression and increase of intraparenchymal hemorrhage in the left with extension into the lateral ventricles bilaterally and small amount of subarachnoid hemorrhage in the sulci. Increasing white matter edema. 5 mm left-to-right midline shift with effacement of sulci on the left. These results were called by telephone at the time of interpretation on 10/15/2015 at 6:42 am to Dr. Ritta Slot , who verbally acknowledged these results. Electronically Signed   By: Burman Nieves M.D.   On: 10/01/2015 06:47    CT of the brain  Progression and increase of intraparenchymal hemorrhage in the left with extension into the lateral ventricles bilaterally and small amount of subarachnoid hemorrhage in the sulci. Increasing white matter edema. 5 mm left-to-right midline shift with effacement of sulci on the left.  MRI of the brain    MRA of the brain    Carotid Doppler    2D Echocardiogram  Done 11/23 Systolic function was normal. The estimated ejection fraction was in the range of 55% to 60%.  CXR      Therapy Recommendations pending  Physical Exam   Neuro: Mental Status: Patient is awake, alert, globally aphasic. He does not follow any commands or answer any questions Cranial Nerves: II: does not blink to threat from the right Pupils are equal, round, and reactive to light.  III,IV, VI: he appears to have a left gaze preference but does cross midline  V: response to stim bilaterally VII: Facial movement is notable for right facial weakness  VIII, X, XI, XII: Unable to assess secondary to patient's altered mental status.  Motor: Tone is  possibly slightly increased in the right leg Bulk is normal. He has paralysis(0/5) of the right upper extremity with movement of the right lower extremity, but some weakness in the lower extremity as well, (3-4/5). He appears to move the left with good strength.  Sensory: Responds to stim x 4.  Cerebellar: Unable to assess secondary to patient's altered mental status.   ASSESSMENT 81 y.o. male who was last seen well around midnight. Subsequently his family heard a big thump around 445 and the patient was discovered to be aphasic and hemiplegic on the right. Of note, history/admitted for intracranial hemorrhage with concern for possible cerebral amyloid. Worsening L sided hemorrhage.     LDL 220  On 11/24     Hospital day # 0  TREATMENT/PLAN  Labetalol and hydralazine PRN SBP goal under 160  CTH repeat tomorrow  Central line placement for 3% with goal of NA <155 due to  slight shift and cerebral edeam  Keppra 500 BID for acute seizure prophylaxis for total 7 days   S/p discussion with family at bedside.   Full code at this time, but further discussion with family .   NG tube for feeding and meds  This patient is critically ill and at significant risk of neurological worsening, death and care requires constant monitoring of vital signs, hemodynamics,respiratory and cardiac monitoring, extensive review of multiple databases, frequent neurological assessment, discussion with family, other specialists and medical decision making of high complexity. High risk of further hemorrhage, seizures, hemodynamic instability.  45 minutes of critical care spent.    SIGNED Pauletta Browns   To contact Stroke Continuity provider, please refer to [email or web address]. After hours, contact General Neurology

## 2015-10-07 NOTE — Consult Note (Deleted)
Neurology Consultation Reason for Consult: Stroke Referring Physician: Mora Bellman, R  CC: Aphasia  History is obtained from: family  HPI: Walter [name] is a 81 y.o. male who was last seen well around midnight. Subsequently his family heard a big thump around 445 and the patient was discovered to be aphasic and hemiplegic on the right. Of note, history/admitted for intracranial hemorrhage with concern for possible cerebral amyloid. There have been plans to repeat imaging after resolution of the hemorrhage, but this has not occurred as of yet.  After relative emergency room, code stroke was activated the patient was taken to CT scanner which showed new impingement, hemorrhage in the same region, but different location from the previous ICH.    LKW:  Midnight tpa given?: no,  hemorrhage    ROS:  Unable to obtain due to altered mental status.   Past Medical History  Diagnosis Date  . Hypertension   . Thyroid disease     hypothyroidism  . Stroke [facility]) 2010     FHx:  Unable to assess secondary to patient's altered mental status.     Social History:  reports that he quit smoking about 32 years ago. He has never used smokeless tobacco. He reports that he does not drink alcohol or use illicit drugs.   Exam: Current vital signs: BP 167/89 mmHg  Pulse 90  Temp(Src) 98.1 F (36.7 C) (Oral)  Resp 18  Ht  (1.778 m)  Wt 74.844 kg (165 lb)  BMI 23.68 kg/m2  SpO2 100% Vital signs in last 24 hours: Temp:  [98.1 F (36.7 C)] 98.1 F (36.7 C) (12/17 0557) Pulse Rate:  [82-90] 90 (12/17 0706) Resp:  [14-18] 18 (12/17 0706) BP: (115-184)/(89-99) 167/89 mmHg (12/17 0706) SpO2:  [97 %-100 %] 100 % (12/17 0706) Weight:  [74.844 kg (165 lb)] 74.844 kg (165 lb) (12/17 0618)   Physical Exam  Constitutional: Appears well-developed and well-nourished.  Psych: Affect appropriate to situation Eyes: No scleral injection HENT: No OP obstrucion Head: Normocephalic.  Cardiovascular:  Normal rate and regular rhythm.  Respiratory: Effort normal and breath sounds normal to anterior ascultation GI: Soft.  No distension. There is no tenderness.  Skin: WDI  Neuro: Mental Status: Patient is awake, alert, globally aphasic. He does not follow any commands or answer any questions Cranial Nerves: II:  does not blink to threat from the right  Pupils are equal, round, and reactive to light.  III,IV, VI:  he appears to have a left gaze preference but does cross midline  V: response to stim bilaterally VII: Facial movement is  notable for right facial weakness  VIII, X, XI, XII: Unable to assess secondary to patient's altered mental status.  Motor: Tone is possibly slightly increased in the right leg Bulk is normal. He has paralysis(0/5) of the right upper extremity with movement of the right lower extremity, but some weakness in the lower extremity as well, (3-4/5). He appears to move the left with good strength.  Sensory: Responds to stim x 4.  Cerebellar: Unable to assess secondary to patient's altered mental status.     I have reviewed labs in epic and the results pertinent to this consultation are: coags normal.  Mild anemia  I have reviewed the images obtained:CT head - new ICH with IVH and some midline shift.   Impression: 81 yo M with ICH with IVH. I agree with Dr. Warren Danes concern for possible cerebral angiopathy . Given the size of the hemorrhage, he will need  to be admitted for close monitoring to the ICU.  Recommendations: 1) Admit to ICU 2) no antiplatelets or anticoagulants 3) blood pressure control with goal systolic < 160 4) Frequent neuro checks 5) If symptoms worsen or there is decreased mental status, repeat stat head CT 6) PT,OT,ST     This patient is critically ill and at significant risk of neurological worsening, death and care requires constant monitoring of vital signs, hemodynamics,respiratory and cardiac monitoring, neurological assessment,  discussion with family, other specialists and medical decision making of high complexity. I spent 45 minutes of neurocritical care time  in the care of  this patient.  Ritta SlotMcNeill Jery Hollern, MD [facility] [phone]  If 7pm- 7am, please page neurology on call as listed in AMION. 10/14/2015  7:41 AM

## 2015-10-08 ENCOUNTER — Inpatient Hospital Stay (HOSPITAL_COMMUNITY): Payer: Medicare Other

## 2015-10-08 DIAGNOSIS — I615 Nontraumatic intracerebral hemorrhage, intraventricular: Secondary | ICD-10-CM | POA: Diagnosis not present

## 2015-10-08 LAB — SODIUM
SODIUM: 141 mmol/L (ref 135–145)
SODIUM: 145 mmol/L (ref 135–145)
Sodium: 144 mmol/L (ref 135–145)

## 2015-10-08 MED ORDER — JEVITY 1.2 CAL PO LIQD
1000.0000 mL | ORAL | Status: DC
  Administered 2015-10-08: 1000 mL
  Filled 2015-10-08 (×4): qty 1000

## 2015-10-08 NOTE — Progress Notes (Signed)
PT Cancellation Note  Patient Details Name: Walter FastRobert J [name] MRN: [id number] DOB: 01/20/1925   Cancelled Treatment:    Reason Eval/Treat Not Completed: Medical issues which prohibited therapy.  Pt is currently on bed rest.  Will need updated activity orders to complete PT evaluation once appropriate.  Thank you for this order.  Michail JewelsAshley Parr PT, DPT [phone] Pager: [phone] 10/08/2015, 8:45 AM

## 2015-10-08 NOTE — Clinical Documentation Improvement (Signed)
Neurology Critical Care  Can the diagnoses of "altered mental status" and "delirium" be further specified?   Acute encephalopathy secondary to stroke  Metabolic/toxic encephalopathy  Other  Clinically Undetermined  Please exercise your independent, professional judgment when responding. A specific answer is not anticipated or expected.   Thank You,  Beverley FiedlerLaurie E Belynda Pagaduan RN [facility] [employer] Information Management  [phone]

## 2015-10-08 NOTE — Evaluation (Signed)
Clinical/Bedside Swallow Evaluation Patient Details  Name: Walter [name] MRN: [id number] Date of Birth: 1925-09-01  Today's Date: 10/08/2015 Time: SLP Start Time (ACUTE ONLY): 1104 SLP Stop Time (ACUTE ONLY): 1130 SLP Time Calculation (min) (ACUTE ONLY): 26 min  Past Medical History:  Past Medical History  Diagnosis Date  . Hypertension   . Thyroid disease     hypothyroidism  . Stroke [facility]) 2010, 08/2015, 09/2015   Past Surgical History:  Past Surgical History  Procedure Laterality Date  . Coronary stent placement     HPI:  81 y.o. male who was last seen well around midnight 12/16-12/17. Subsequently his family heard a big thump around 445 and the patient was discovered to be aphasic and hemiplegic on the right. Of note, history/admitted for intracranial hemorrhage with concern for possible cerebral amyloid. There have been plans to repeat imaging after resolution of the hemorrhage, but this has not occurred as of yet. Ct head shows Progression and increase of intraparenchymal hemorrhage in the left with extension into the lateral ventricles bilaterally and small   Assessment / Plan / Recommendation Clinical Impression  Pt demosntrates signs of a moderate to severe oral and oropharyngeal dysphagia. Pt keeping eyes closed during assessment, but fully responsive to tactile cues. Pt immediately closes lips to spoon with ice and masticates and swallows ice chip, though late gurgle and weak throat clear heard indicative of decreased sensation of penetration/residual. SLP could not elicit appropriate cough/throat clear with max verbal or tactile cues. Recommend pt remain NPO with PO trials for readiness for objective testing as arousal improves.     Aspiration Risk  Severe aspiration risk    Diet Recommendation NPO;Alternative means - temporary   Medication Administration: Via alternative means    Other  Recommendations Oral Care Recommendations: Oral care QID Other  Recommendations: Have oral suction available   Follow up Recommendations  Inpatient Rehab    Frequency and Duration min 2x/week  2 weeks       Prognosis Prognosis for Safe Diet Advancement: Fair      Swallow Study   General HPI: 81 y.o. male who was last seen well around midnight 12/16-12/17. Subsequently his family heard a big thump around 445 and the patient was discovered to be aphasic and hemiplegic on the right. Of note, history/admitted for intracranial hemorrhage with concern for possible cerebral amyloid. There have been plans to repeat imaging after resolution of the hemorrhage, but this has not occurred as of yet. Ct head shows Progression and increase of intraparenchymal hemorrhage in the left with extension into the lateral ventricles bilaterally and small Type of Study: Bedside Swallow Evaluation Previous Swallow Assessment: none in chart Diet Prior to this Study: NPO;NG Tube Temperature Spikes Noted: No Respiratory Status: Nasal cannula History of Recent Intubation: No Behavior/Cognition: Lethargic/Drowsy;Confused Oral Care Completed by SLP: No Oral Cavity - Dentition: Adequate natural dentition Vision: Impaired for self-feeding Self-Feeding Abilities: Total assist Patient Positioning: Upright in bed Baseline Vocal Quality: Not observed Volitional Cough: Cognitively unable to elicit Volitional Swallow: Unable to elicit    Oral/Motor/Sensory Function Overall Oral Motor/Sensory Function: Severe impairment Facial ROM: Reduced right Facial Symmetry: Abnormal symmetry right Facial Strength: Reduced right Lingual ROM: Other (Comment) (pt would not follow commands ) Mandible: Within Functional Limits   Ice Chips Ice chips: Impaired Presentation: Spoon Pharyngeal Phase Impairments: Wet Vocal Quality;Decreased hyoid-laryngeal movement;Throat Clearing - Delayed   Thin Liquid Thin Liquid: Not tested    Nectar Thick Nectar Thick Liquid: Not tested  Honey Thick Honey  Thick Liquid: Not tested   Puree Puree: Not tested   Solid Solid: Not tested      Harlon DittyBonnie Bitania Shankland, MA CCC-SLP [phone]  Walter [name], Walter [name] 10/08/2015,11:38 AM

## 2015-10-08 NOTE — Progress Notes (Signed)
Stroke Team Progress Note  HISTORY 81 y.o. male who was last seen well around midnight. Subsequently his family heard a big thump around 445 and the patient was discovered to be aphasic and hemiplegic on the right. Of note, history/admitted for intracranial hemorrhage with concern for possible cerebral amyloid. There have been plans to repeat imaging after resolution of the hemorrhage, but this has not occurred as of yet.  SUBJECTIVE Appears to be delirious. Repeat CTH with increased edema and midline shift.    OBJECTIVE Most recent Vital Signs: Filed Vitals:   10/08/15 0700 10/08/15 0724 10/08/15 0800 10/08/15 0900  BP: 128/93  128/63 145/83  Pulse: 87  83 83  Temp:  101 F (38.3 C)    TempSrc:  Axillary    Resp: Height:      Weight:      SpO2: 97%  98% 100%   CBG (last 3)   Recent Labs  09/23/2015 0559  GLUCAP 155*    IV Fluid Intake:   . feeding supplement (JEVITY 1.2 CAL)    . sodium chloride (hypertonic) 50 mL/hr (10/08/15 0358)    MEDICATIONS  .  stroke: mapping our early stages of recovery book   Does not apply Once  . antiseptic oral rinse  7 mL Mouth Rinse q12n4p  . chlorhexidine  15 mL Mouth Rinse BID  . Influenza vac split quadrivalent PF  0.5 mL Intramuscular Tomorrow-1000  . levETIRAcetam  500 mg Intravenous Q12H  . pantoprazole (PROTONIX) IV  40 mg Intravenous QHS  . pneumococcal 23 valent vaccine  0.5 mL Intramuscular Tomorrow-1000  . senna-docusate  1 tablet Oral BID   PRN:  acetaminophen **OR** acetaminophen, hydrALAZINE, labetalol  Diet:  Diet NPO time specified Activity:  Bedrest DVT Prophylaxis:  SCDs  CLINICALLY SIGNIFICANT STUDIES Basic Metabolic Panel:  Recent Labs Lab 10/06/2015 0624 09/24/2015 0630  09/30/2015 2319 10/08/15 0639  NA 138 139  < > 139 141  K 4.4 4.3  --   --   --   CL 103 104  --   --   --   CO2 22  --   --   --   --   GLUCOSE 162* 162*  --   --   --   BUN 30* 30*  --   --   --   CREATININE 1.37* 1.20  --   --    --   CALCIUM 8.9  --   --   --   --   < > = values in this interval not displayed. Liver Function Tests:   Recent Labs Lab 09/30/2015 0624  AST 21  ALT 13*  ALKPHOS 77  BILITOT 0.7  PROT 6.8  ALBUMIN 3.6   CBC:   Recent Labs Lab 09/29/2015 0624 10/17/2015 0630  WBC 9.9  --   NEUTROABS 8.5*  --   HGB 11.6* 12.2*  HCT 36.0* 36.0*  MCV 90.0  --   PLT 178  --    Coagulation:   Recent Labs Lab 09/28/2015 0624  LABPROT 13.4  INR 1.00   Cardiac Enzymes: No results for input(s): CKTOTAL, CKMB, CKMBINDEX, TROPONINI in the last 168 hours. Urinalysis:   Recent Labs Lab 09/27/2015 0659  COLORURINE YELLOW  LABSPEC 1.012  PHURINE 7.0  GLUCOSEU NEGATIVE  HGBUR NEGATIVE  BILIRUBINUR NEGATIVE  KETONESUR NEGATIVE  PROTEINUR NEGATIVE  NITRITE NEGATIVE  LEUKOCYTESUR NEGATIVE   Lipid Panel    Component Value Date/Time   CHOL 286*  09/14/2015 0446   TRIG 103 09/14/2015 0446   HDL 45 09/14/2015 0446   CHOLHDL 6.4 09/14/2015 0446   VLDL 21 09/14/2015 0446   LDLCALC 220* 09/14/2015 0446   HgbA1C  Lab Results  Component Value Date   HGBA1C 6.0* 09/14/2015    Urine Drug Screen:      Component Value Date/Time   LABOPIA NONE DETECTED 2015-10-10 0659   COCAINSCRNUR NONE DETECTED October 10, 2015 0659   LABBENZ NONE DETECTED Oct 10, 2015 0659   AMPHETMU NONE DETECTED 2015-10-10 0659   THCU NONE DETECTED October 10, 2015 0659   LABBARB NONE DETECTED Oct 10, 2015 0659    Alcohol Level:   Recent Labs Lab 10-10-2015 0624  ETH <5    Ct Head Wo Contrast  10/08/2015  CLINICAL DATA:  Follow-up for intracranial hemorrhage. EXAM: CT HEAD WITHOUT CONTRAST TECHNIQUE: Contiguous axial images were obtained from the base of the skull through the vertex without intravenous contrast. COMPARISON:  Prior CT from 10/10/2015. FINDINGS: Lobar parenchymal hematoma centered within the posterior left frontal region dx is similar in size measuring 4.2 x 4.8 cm in transaxial dimension. Separate hematoma centered  within the left temporal region slightly more inferiorly measures approximately 4.2 cm, which appears overall slightly increased in size relative to prior exam. Scattered adjacent subarachnoid hemorrhage within the left frontal and temporal lobes with subarachnoid blood in the left sylvian fissure. Overall, degree of subarachnoid hemorrhage is slightly decreased from prior. Associated edema with sulcal effacement again seen throughout the left frontal, temporal, and parietal lobes. Overall, edema is slightly worsened. There is up to 9 mm of left-to-right shift, slightly worsened from previous. Basilar cisterns remain patent. No significant uncal herniation. Again, there is intraventricular extension of blood, with overall slightly increased amount of intraventricular hemorrhage as compared to previous exam. Left lateral ventricle is partially effaced. Overall, size of the ventricles is slightly increased relative to previous exam with increased dilatation of the right lateral ventricle including the temporal horn, worrisome for possible developing in ventricular entrapment. No acute infarct. Prominent hypodensity within the anterior left temporal horn favored to be related to edema. No extra-axial fluid collection. Atrophy was chronic small vessel ischemic changes again noted. Scalp soft tissues within normal limits. No acute abnormality about the orbits. Paranasal sinuses are clear. Nasogastric tube in place. No mastoid effusion. Calvarium intact. IMPRESSION: 1. Similar size of dominant parenchymal hematoma within the posterior left frontal region, but with slightly increased size of additional hematoma more inferiorly within the left temporal region. Associated edema is slightly worsened, with increased left-to-right shift now measuring up to 8 mm. 2. Increased volume of intraventricular hemorrhage with mildly increased ventricular dilatation. Particularly, the right lateral ventricle is most notably increased in  size with increased dilatation of the temporal horn, worrisome for possible developing ventricular entrapment. 3. Interval decrease in subarachnoid hemorrhage within the left cerebral hemisphere. Electronically Signed   By: Rise Mu M.D.   On: 10/08/2015 06:44   Ct Head Wo Contrast  Oct 10, 2015  CLINICAL DATA:  Code stroke. Sudden onset aphasia. Less responsive. EXAM: CT HEAD WITHOUT CONTRAST TECHNIQUE: Contiguous axial images were obtained from the base of the skull through the vertex without intravenous contrast. COMPARISON:  09/13/2015 FINDINGS: Increasing large left posterior frontal, parietal, and temporal intraparenchymal hematoma. Largest portion of the hematoma measures about 4.8 x 4.2 cm in axial dimension. Additional hematoma centered in the temporal region measures about 3.2 cm in diameter. There is associated mass effect with sulcal effacement and about 5 mm left-to-right midline shift.  Mass effect is increasing since previous study. There is bilateral intraventricular hemorrhage. Suggestion of a small amount of hemorrhage in the frontal sulci. There is associated white matter edema in the left cerebral hemisphere. Diffuse underlying cortical atrophy. Ventricular dilatation is most consistent with central atrophy. Low-attenuation changes throughout the deep white matter consistent with small vessel ischemia. No abnormal extra-axial fluid collections. Calvarium appears intact. Visualized paranasal sinuses and mastoid air cells are not opacified. Vascular calcifications. IMPRESSION: Progression and increase of intraparenchymal hemorrhage in the left with extension into the lateral ventricles bilaterally and small amount of subarachnoid hemorrhage in the sulci. Increasing white matter edema. 5 mm left-to-right midline shift with effacement of sulci on the left. These results were called by telephone at the time of interpretation on 10/06/2015 at 6:42 am to Dr. Ritta SlotMCNEILL KIRKPATRICK , who  verbally acknowledged these results. Electronically Signed   By: Burman NievesWilliam  Stevens M.D.   On: 10/16/2015 06:47   Dg Chest Port 1 View  10/14/2015  CLINICAL DATA:  Intracranial hemorrhage. Status post central line placement. EXAM: PORTABLE CHEST 1 VIEW COMPARISON:  09/13/2015 FINDINGS: Left jugular central line is been placed with the catheter tip located at the origin of the SVC. No pneumothorax. Lung volumes are low bilaterally. No infiltrate, edema or pleural fluid identified. The heart size is stable. IMPRESSION: No pneumothorax after central line placement. The catheter tip lies at the origin of the SVC. Electronically Signed   By: Irish LackGlenn  Yamagata M.D.   On: 09/21/2015 12:46   Dg Abd Portable 1v  10/04/2015  CLINICAL DATA:  Evaluate feeding tube placement EXAM: PORTABLE ABDOMEN - 1 VIEW COMPARISON:  09/13/2015 FINDINGS: The feeding tube tip is in the expected location of the pylorus. There is mild gaseous distension of the gastric lumen. No dilated loops of bowel noted. IMPRESSION: 1. Tip of the feeding tube is in the expected location of the pylorus. Electronically Signed   By: Signa Kellaylor  Stroud M.D.   On: 10/01/2015 18:53    CT of the brain  Progression and increase of intraparenchymal hemorrhage in the left with extension into the lateral ventricles bilaterally and small amount of subarachnoid hemorrhage in the sulci. Increasing white matter edema. 5 mm left-to-right midline shift with effacement of sulci on the left.  MRI of the brain    MRA of the brain    Carotid Doppler    2D Echocardiogram  Done 11/23 Systolic function was normal. The estimated ejection fraction was in the range of 55% to 60%.  CXR      Therapy Recommendations pending  Physical Exam   Neuro: Mental Status: Patient is awake, alert, globally aphasic. He does not follow any commands or answer any questions Cranial Nerves: II: does not blink to threat from the right Pupils are equal, round, and reactive  to light.  III,IV, VI: he appears to have a left gaze preference but does cross midline  V: response to stim bilaterally VII: Facial movement is notable for right facial weakness  VIII, X, XI, XII: Unable to assess secondary to patient's altered mental status.  Motor: Tone is possibly slightly increased in the right leg Bulk is normal. He has paralysis(0/5) of the right upper extremity with movement of the right lower extremity, but some weakness in the lower extremity as well, (3-4/5). He appears to move the left with good strength.  Sensory: Responds to stim x 4.  Cerebellar: Unable to assess secondary to patient's altered mental status.   ASSESSMENT 81 y.o. male  who was last seen well around midnight. Subsequently his family heard a big thump around 445 and the patient was discovered to be aphasic and hemiplegic on the right. Of note, history/admitted for intracranial hemorrhage with concern for possible cerebral amyloid. Worsening L sided hemorrhage.     LDL 220  On 11/24     Hospital day # 1  TREATMENT/PLAN  Labetalol and hydralazine PRN SBP goal under 160. No PRNs needed overnight and AM. Will hold off starting any b/p Meds  Will start tube feeding.    CTH repeat showing increased midline shift  Central line placed for 3% with goal of NA <155 due to slight shift and cerebral edema increased to 40cc/hr   Sodium q 6 hrs.   There is intraventricular hemorrhage but no casting of 4th or 3rd ventricle. Mild hydrocephalus. No need for EVD yet  Blood is subcortical with a room to swell due to atrophy, do not think need surgical intervention.   Keppra 500 BID for acute seizure prophylaxis for total 7 days   S/p discussion with family at bedside. Family would like pt DNR but aggressive care  CXR in AM for possible aspiration. No fevers.   NG tube placed.    This patient is critically ill and at significant risk of neurological worsening, death and care requires  constant monitoring of vital signs, hemodynamics,respiratory and cardiac monitoring, extensive review of multiple databases, frequent neurological assessment, discussion with family, other specialists and medical decision making of high complexity. High risk of further hemorrhage, seizures, hemodynamic instability.  65 minutes of critical care spent.    SIGNED Pauletta Browns   To contact Stroke Continuity provider, please refer to [email or web address]. After hours, contact General Neurology

## 2015-10-08 NOTE — Progress Notes (Signed)
Utilization Review Completed.Walter [name] T12/18/2016  

## 2015-10-08 NOTE — Evaluation (Signed)
Speech Language Pathology Evaluation Patient Details Name: Walter [name] MRN: [id number] DOB: 19-Sep-1925 Today's Date: 10/08/2015 Time: [id number] SLP Time Calculation (min) (ACUTE ONLY): 26 min  Problem List:  Patient Active Problem List   Diagnosis Date Noted  . Cerebral edema (HCC)   . Hemorrhagic stroke (HCC)   . Thyroid disease 09/14/2015  . Essential hypertension 09/14/2015  . ICH (intracerebral hemorrhage) (HCC) - trauma vs cerebral amyloid angiopathy 09/13/2015  . Dementia 06/18/2013  . CORONARY ARTERY DISEASE 12/07/2009  . GERD 12/07/2009  . PEPTIC ULCER, ACUTE, HEMORRHAGE, HX OF 12/07/2009  . PERSONAL HISTORY OF COLONIC POLYPS 12/07/2009   Past Medical History:  Past Medical History  Diagnosis Date  . Hypertension   . Thyroid disease     hypothyroidism  . Stroke [facility](HCC) 2010, 08/2015, 09/2015   Past Surgical History:  Past Surgical History  Procedure Laterality Date  . Coronary stent placement     HPI:  81 y.o. male who was last seen well around midnight 12/16-12/17. Subsequently his family heard a big thump around 445 and the patient was discovered to be aphasic and hemiplegic on the right. Of note, history/admitted for intracranial hemorrhage with concern for possible cerebral amyloid. There have been plans to repeat imaging after resolution of the hemorrhage, but this has not occurred as of yet. Ct head shows Progression and increase of intraparenchymal hemorrhage in the left with extension into the lateral ventricles bilaterally and small   Assessment / Plan / Recommendation Clinical Impression  Pts cognitive linguistic function is severely impaired. Difficult to determine nature of deficits at this time given lethargy and poor attention/awareness. Suspect global aphsaia though pt kept eyes closed most of session and did not focus attention to any functional task with max cues except for masticating ice chip. Pt did not follow any commands or make any effort to  verbalize. Suspect dysarthria given visible CN VII weakness. Will continue to follow for diagnostic tx.     SLP Assessment  Patient needs continued Speech Lanaguage Pathology Services    Follow Up Recommendations  Inpatient Rehab    Frequency and Duration min 3x week  2 weeks      SLP Evaluation Prior Functioning  Cognitive/Linguistic Baseline: Information not available Type of Home: House Available Help at Discharge: Family;Available 24 hours/day   Cognition  Overall Cognitive Status: Impaired/Different from baseline Arousal/Alertness: Lethargic Orientation Level: Other (comment) (no response) Attention: Focused Focused Attention: Impaired Focused Attention Impairment: Functional basic;Verbal basic    Comprehension  Auditory Comprehension Overall Auditory Comprehension: Impaired Yes/No Questions: Impaired Basic Biographical Questions: 0-25% accurate Commands: Impaired One Step Basic Commands: 0-24% accurate Interfering Components: Visual impairments;Attention    Expression Verbal Expression Overall Verbal Expression: Impaired (did not verbalize. phonated x1 with throat clear)   Oral / Motor Oral Motor/Sensory Function Overall Oral Motor/Sensory Function: Severe impairment Facial ROM: Reduced right Facial Symmetry: Abnormal symmetry right Facial Strength: Reduced right Lingual ROM: Other (Comment) Mandible: Within Functional Limits Motor Speech Overall Motor Speech: Other (comment) (no attempts at speech)   Walter DittyBonnie Amoria Mclees, MA CCC-SLP [phone]  Walter [name], Walter [name] 10/08/2015, 11:46 AM

## 2015-10-09 ENCOUNTER — Inpatient Hospital Stay (HOSPITAL_COMMUNITY): Payer: Medicare Other

## 2015-10-09 DIAGNOSIS — I612 Nontraumatic intracerebral hemorrhage in hemisphere, unspecified: Secondary | ICD-10-CM

## 2015-10-09 LAB — SODIUM
SODIUM: 144 mmol/L (ref 135–145)
SODIUM: 153 mmol/L — AB (ref 135–145)
SODIUM: 153 mmol/L — AB (ref 135–145)
Sodium: 150 mmol/L — ABNORMAL HIGH (ref 135–145)

## 2015-10-09 LAB — CBC
HEMATOCRIT: 31.4 % — AB (ref 39.0–52.0)
HEMOGLOBIN: 10.1 g/dL — AB (ref 13.0–17.0)
MCH: 29.5 pg (ref 26.0–34.0)
MCHC: 32.2 g/dL (ref 30.0–36.0)
MCV: 91.8 fL (ref 78.0–100.0)
Platelets: 155 10*3/uL (ref 150–400)
RBC: 3.42 MIL/uL — ABNORMAL LOW (ref 4.22–5.81)
RDW: 14.2 % (ref 11.5–15.5)
WBC: 9.6 10*3/uL (ref 4.0–10.5)

## 2015-10-09 LAB — BASIC METABOLIC PANEL
ANION GAP: 9 (ref 5–15)
BUN: 23 mg/dL — ABNORMAL HIGH (ref 6–20)
CHLORIDE: 113 mmol/L — AB (ref 101–111)
CO2: 25 mmol/L (ref 22–32)
CREATININE: 1.18 mg/dL (ref 0.61–1.24)
Calcium: 8.8 mg/dL — ABNORMAL LOW (ref 8.9–10.3)
GFR calc non Af Amer: 53 mL/min — ABNORMAL LOW (ref >60)
GLUCOSE: 133 mg/dL — AB (ref 65–99)
Potassium: 3.2 mmol/L — ABNORMAL LOW (ref 3.5–5.1)
Sodium: 147 mmol/L — ABNORMAL HIGH (ref 135–145)

## 2015-10-09 LAB — GLUCOSE, CAPILLARY
GLUCOSE-CAPILLARY: 141 mg/dL — AB (ref 65–99)
Glucose-Capillary: 129 mg/dL — ABNORMAL HIGH (ref 65–99)

## 2015-10-09 MED ORDER — JEVITY 1.2 CAL PO LIQD
1000.0000 mL | ORAL | Status: DC
  Filled 2015-10-09 (×4): qty 1000

## 2015-10-09 MED ORDER — JEVITY 1.2 CAL PO LIQD: 1000.0000 mL | ORAL | Status: DC

## 2015-10-09 NOTE — Progress Notes (Signed)
PT Cancellation Note  Patient Details Name: Walter FastRobert J [name] MRN: [id number] DOB: Nov 18, 1924   Cancelled Treatment:    Reason Eval/Treat Not Completed: Patient not medically ready.  Pt remains on bedrest.  Please update activity orders once appropriate for PT and mobility.     Sunny SchleinRitenour, Banesa Tristan F, South CarolinaPT [phone] 10/09/2015, 11:56 AM

## 2015-10-09 NOTE — H&P (Signed)
Neurology Consultation Reason for Consult: Stroke Referring Physician: Mora Bellmanni, R  CC: Aphasia  History is obtained from: family  HPI: Walter FastRobert J [name] is a 81 y.o. male who was last seen well around midnight. Subsequently his family heard a big thump around 445 and the patient was discovered to be aphasic and hemiplegic on the right. Of note, history/admitted for intracranial hemorrhage with concern for possible cerebral amyloid. There have been plans to repeat imaging after resolution of the hemorrhage, but this has not occurred as of yet.  After relative emergency room, code stroke was activated the patient was taken to CT scanner which showed new impingement, hemorrhage in the same region, but different location from the previous ICH.    LKW:  Midnight tpa given?: no,  hemorrhage    ROS:  Unable to obtain due to altered mental status.   Past Medical History  Diagnosis Date  . Hypertension   . Thyroid disease     hypothyroidism  . Stroke [facility](HCC) 2010, 08/2015, 09/2015     FHx:  Unable to assess secondary to patient's altered mental status.     Social History:  reports that he quit smoking about 32 years ago. He has never used smokeless tobacco. He reports that he does not drink alcohol or use illicit drugs.   Exam: Current vital signs: BP 148/90 mmHg  Pulse 84  Temp(Src) 98.3 F (36.8 C) (Axillary)  Resp 17  Ht 5\' 10"  (1.778 m)  Wt 74.844 kg (165 lb)  BMI 23.68 kg/m2  SpO2 98% Vital signs in last 24 hours: Temp:  [98.3 F (36.8 C)-100.3 F (37.9 C)] 98.3 F (36.8 C) (12/19 0804) Pulse Rate:  [61-105] 84 (12/19 0900) Resp:  [9-22] 17 (12/19 0900) BP: (97-180)/(54-98) 148/90 mmHg (12/19 0900) SpO2:  [92 %-100 %] 98 % (12/19 0900)   Physical Exam  Constitutional: Appears well-developed and well-nourished.  Psych: Affect appropriate to situation Eyes: No scleral injection HENT: No OP obstrucion Head: Normocephalic.  Cardiovascular: Normal rate and regular  rhythm.  Respiratory: Effort normal and breath sounds normal to anterior ascultation GI: Soft.  No distension. There is no tenderness.  Skin: WDI  Neuro: Mental Status: Patient is awake, alert, globally aphasic. He does not follow any commands or answer any questions Cranial Nerves: II:  does not blink to threat from the right  Pupils are equal, round, and reactive to light.  III,IV, VI:  he appears to have a left gaze preference but does cross midline  V: response to stim bilaterally VII: Facial movement is  notable for right facial weakness  VIII, X, XI, XII: Unable to assess secondary to patient's altered mental status.  Motor: Tone is possibly slightly increased in the right leg Bulk is normal. He has paralysis(0/5) of the right upper extremity with movement of the right lower extremity, but some weakness in the lower extremity as well, (3-4/5). He appears to move the left with good strength.  Sensory: Responds to stim x 4.  Cerebellar: Unable to assess secondary to patient's altered mental status.     I have reviewed labs in epic and the results pertinent to this consultation are: coags normal.  Mild anemia  I have reviewed the images obtained:CT head - new ICH with IVH and some midline shift.   Impression: 81 yo M with ICH with IVH. I agree with Dr. Warren DanesXu's concern for possible cerebral angiopathy . Given the size of the hemorrhage, he will need to be admitted for close monitoring to  the ICU.  Recommendations: 1) Admit to ICU 2) no antiplatelets or anticoagulants 3) blood pressure control with goal systolic < 160 4) Frequent neuro checks 5) If symptoms worsen or there is decreased mental status, repeat stat head CT 6) PT,OT,ST     This patient is critically ill and at significant risk of neurological worsening, death and care requires constant monitoring of vital signs, hemodynamics,respiratory and cardiac monitoring, neurological assessment, discussion with family,  other specialists and medical decision making of high complexity. I spent 45 minutes of neurocritical care time  in the care of  this patient.  Ritta Slot, MD [facility] [phone]  If 7pm- 7am, please page neurology on call as listed in AMION. 10/09/2015  9:16 AM

## 2015-10-09 NOTE — Progress Notes (Addendum)
Left Wrist restraint started . Pt pulled cortrak feeding tube . Feeding tube still in place. Received restraint from Dr Roseanne RenoStewart.

## 2015-10-09 NOTE — Progress Notes (Signed)
Speech Language Pathology Treatment: Dysphagia;Cognitive-Linquistic  Patient Details Name: Walter [name] MRN: [id number] DOB: 02-Apr-1925 Today's Date: 10/09/2015 Time: [id number] SLP Time Calculation (min) (ACUTE ONLY): 9 min  Assessment / Plan / Recommendation Clinical Impression  Pt initially has increased eye opening, although quickly has difficulty maintaining this and appears to fall asleep. SLP provided Total A for oral care via suctioning with small amounts of labial parting in response. Few ice chips administered as pt has little to no oral movements in response. Oral phase is passive until suspect thin liquid has spilled posteriorly into the pharynx, at which time a pharyngeal response is ultimately triggered. Although no overt s/s of aspiration are observed, he has a wet vocal quality at baseline and does not follow commands to cough and clear. Total A provided throughout session for command following. Will continue to monitor for progress to determine if pt can participate in instrumental swallowing test.   HPI HPI: 81 y.o. male who was last seen well around midnight 12/16-12/17. Subsequently his family heard a big thump around 445 and the patient was discovered to be aphasic and hemiplegic on the right. Of note, history/admitted for intracranial hemorrhage with concern for possible cerebral amyloid. There have been plans to repeat imaging after resolution of the hemorrhage, but this has not occurred as of yet. Ct head shows Progression and increase of intraparenchymal hemorrhage in the left with extension into the lateral ventricles bilaterally and small      SLP Plan  Continue with current plan of care     Recommendations  Diet recommendations: NPO Medication Administration: Via alternative means       Oral Care Recommendations: Oral care QID Follow up Recommendations: Inpatient Rehab Plan: Continue with current plan of care   Walter [name], M.A.  CCC-SLP [phone]  Walter [name], Walter [name] 10/09/2015, 10:38 AM

## 2015-10-09 NOTE — Progress Notes (Signed)
Head CT Scan done at this time.

## 2015-10-09 NOTE — Progress Notes (Signed)
OT Cancellation Note  Patient Details Name: Walter FastRobert J [name] MRN: [id number] DOB: Nov 29, 1924   Cancelled Treatment:    Reason Eval/Treat Not Completed: Patient not medically ready (BR)  Felecia ShellingJones, Lynford Espinoza B   Lashawnna Lambrecht, Brynn   OTR/L Pager: [phone] Office: [phone] .  10/09/2015, 8:20 AM

## 2015-10-09 NOTE — Progress Notes (Signed)
Initial Nutrition Assessment  INTERVENTION:  Initiate Jevity 1.2 @ 20 ml/hr via Cotrak NG tube and increase by 10 ml every 4 hours to goal rate of 65 ml/hr.   Tube feeding regimen provides 1872 kcal (100% of needs), 86 grams of protein, and 1264 ml of H2O.    NUTRITION DIAGNOSIS:   Inadequate oral intake related to inability to eat as evidenced by NPO status.   GOAL:   Patient will meet greater than or equal to 90% of their needs   MONITOR:   TF tolerance, Labs, Weight trends, Skin, Diet advancement  REASON FOR ASSESSMENT:   Consult Enteral/tube feeding initiation and management  ASSESSMENT:   81 y.o. male who was last seen well around midnight. Subsequently his family heard a big thump around 445 and the patient was discovered to be aphasic and hemiplegic on the right. Of note, history/admitted for intracranial hemorrhage with concern for possible cerebral amyloid. Worsening L sided hemorrhage.   Pt had Cortrak tube in place and Jevity 1.2 was started at 20 ml/hr 12/18. Tube was pulled this AM, now replaced and awaiting placement confirmation. No family present at time of visit.   Labs: low hemoglobin, elevated sodium  Diet Order:  Diet NPO time specified  Skin:  Reviewed, no issues  Last BM:  PTA  Height:   Ht Readings from Last 1 Encounters:  10/20/2015 5\' 10"  (1.778 m)    Weight:   Wt Readings from Last 1 Encounters:  10/10/2015 165 lb (74.844 kg)    Ideal Body Weight:  75.5 kg  BMI:  Body mass index is 23.68 kg/(m^2).  Estimated Nutritional Needs:   Kcal:  1775-1950  Protein:  90-100 grams  Fluid:  1.7-1.9 L/day  EDUCATION NEEDS:   No education needs identified at this time  Dorothea Ogleeanne Sevana Grandinetti RD, LDN Inpatient Clinical Dietitian Pager: [phone] After Hours Pager: [phone]

## 2015-10-09 NOTE — Progress Notes (Signed)
Stroke Team Progress Note  HISTORY 81 y.o. male who was last seen well around midnight. Subsequently his family heard a big thump around 445 and the patient was discovered to be aphasic and hemiplegic on the right. Of note, history/admitted for intracranial hemorrhage with concern for possible cerebral amyloid. There have been plans to repeat imaging after resolution of the hemorrhage, but this has not occurred as of yet.  SUBJECTIVE Appears to be aphasic and right hemipareis. Repeat [facility]  10/09/15 with persistent edema and midline shift.    but no significant change OBJECTIVE Most recent Vital Signs: Filed Vitals:   10/09/15 1200 10/09/15 1204 10/09/15 1300 10/09/15 1400  BP: 147/78  150/81 141/66  Pulse: 77  78 77  Temp:  97.9 F (36.6 C)    TempSrc:  Axillary    Resp: 13  16 12   Height:      Weight:      SpO2: 98%  99% 98%   CBG (last 3)   Recent Labs  10-30-15 0559  GLUCAP 155*    IV Fluid Intake:   . feeding supplement (JEVITY 1.2 CAL)    . sodium chloride (hypertonic) 40 mL/hr at 10/09/15 1400    MEDICATIONS  .  stroke: mapping our early stages of recovery book   Does not apply Once  . antiseptic oral rinse  7 mL Mouth Rinse q12n4p  . chlorhexidine  15 mL Mouth Rinse BID  . levETIRAcetam  500 mg Intravenous Q12H  . pantoprazole (PROTONIX) IV  40 mg Intravenous QHS  . senna-docusate  1 tablet Oral BID   PRN:  acetaminophen **OR** acetaminophen, hydrALAZINE, labetalol  Diet:  Diet NPO time specified Activity:  Bedrest DVT Prophylaxis:  SCDs  CLINICALLY SIGNIFICANT STUDIES Basic Metabolic Panel:  Recent Labs Lab 30-Oct-2015 0624 2015/10/30 0630  10/09/15 0555 10/09/15 1145  NA 138 139  < > 147* 150*  K 4.4 4.3  --  3.2*  --   CL 103 104  --  113*  --   CO2 22  --   --  25  --   GLUCOSE 162* 162*  --  133*  --   BUN 30* 30*  --  23*  --   CREATININE 1.37* 1.20  --  1.18  --   CALCIUM 8.9  --   --  8.8*  --   < > = values in this interval not displayed. Liver  Function Tests:   Recent Labs Lab 2015-10-30 0624  AST 21  ALT 13*  ALKPHOS 77  BILITOT 0.7  PROT 6.8  ALBUMIN 3.6   CBC:   Recent Labs Lab 10-30-15 0624 10/30/15 0630 10/09/15 0555  WBC 9.9  --  9.6  NEUTROABS 8.5*  --   --   HGB 11.6* 12.2* 10.1*  HCT 36.0* 36.0* 31.4*  MCV 90.0  --  91.8  PLT 178  --  155   Coagulation:   Recent Labs Lab Oct 30, 2015 0624  LABPROT 13.4  INR 1.00   Cardiac Enzymes: No results for input(s): CKTOTAL, CKMB, CKMBINDEX, TROPONINI in the last 168 hours. Urinalysis:   Recent Labs Lab Oct 30, 2015 0659  COLORURINE YELLOW  LABSPEC 1.012  PHURINE 7.0  GLUCOSEU NEGATIVE  HGBUR NEGATIVE  BILIRUBINUR NEGATIVE  KETONESUR NEGATIVE  PROTEINUR NEGATIVE  NITRITE NEGATIVE  LEUKOCYTESUR NEGATIVE   Lipid Panel    Component Value Date/Time   CHOL 286* 09/14/2015 0446   TRIG 103 09/14/2015 0446   HDL 45 09/14/2015 0446   CHOLHDL  6.4 09/14/2015 0446   VLDL 21 09/14/2015 0446   LDLCALC 220* 09/14/2015 0446   HgbA1C  Lab Results  Component Value Date   HGBA1C 6.0* 09/14/2015    Urine Drug Screen:      Component Value Date/Time   LABOPIA NONE DETECTED Jan 16, 2015 0659   COCAINSCRNUR NONE DETECTED Jan 16, 2015 0659   LABBENZ NONE DETECTED Jan 16, 2015 0659   AMPHETMU NONE DETECTED Jan 16, 2015 0659   THCU NONE DETECTED Jan 16, 2015 0659   LABBARB NONE DETECTED Jan 16, 2015 0659    Alcohol Level:   Recent Labs Lab December 11, 2014 0624  ETH <5    Ct Head Wo Contrast  10/09/2015  CLINICAL DATA:  Followup intracranial hemorrhage. EXAM: CT HEAD WITHOUT CONTRAST TECHNIQUE: Contiguous axial images were obtained from the base of the skull through the vertex without intravenous contrast. COMPARISON:  Prior CT from 10/08/2015 as well as earlier studies. FINDINGS: Study degraded by motion artifact. Parenchymal hematoma centered within the posterior left frontal lobe overall similar in size, measuring 4.9 x 5.0 cm on this exam. Differences in measurement likely  related to patient positioning and gantry angulation. Additional hematoma within the left temporal lobe is overall stable measuring 4.1 cm. Scattered subarachnoid hemorrhage within the left frontotemporal region slightly decreased. Associated vasogenic edema not significantly changed. Associated left-to-right shift measures approximately 8 mm, stable. Intraventricular extension of hemorrhage again seen. Overall, volume of intraventricular blood is slightly decreased. Ventricular dilatation grossly stable. Again, there is mild asymmetric dilatation of the right lateral ventricle. No overt hydrocephalus. Basilar cisterns remain patent. Mild left uncal herniation. Atrophy with chronic microvascular ischemic disease again noted. No acute large vessel territory infarct. No extra-axial fluid collection. Scalp soft tissues within normal limits. No definite acute abnormality about the orbits. Paranasal sinuses are grossly clear. No definite mastoid effusion. Calvarium grossly stable. IMPRESSION: Overall little interval change. Stable size of left frontal and temporal hematomas with similar vasogenic edema and 8 mm of left-to-right shift. Intraventricular blood and subarachnoid hemorrhage is slightly decreased. Ventricular dilatation is similar. Electronically Signed   By: Rise MuBenjamin  McClintock M.D.   On: 10/09/2015 06:27   Ct Head Wo Contrast  10/08/2015  CLINICAL DATA:  Follow-up for intracranial hemorrhage. EXAM: CT HEAD WITHOUT CONTRAST TECHNIQUE: Contiguous axial images were obtained from the base of the skull through the vertex without intravenous contrast. COMPARISON:  Prior CT from Jan 16, 2015. FINDINGS: Lobar parenchymal hematoma centered within the posterior left frontal region dx is similar in size measuring 4.2 x 4.8 cm in transaxial dimension. Separate hematoma centered within the left temporal region slightly more inferiorly measures approximately 4.2 cm, which appears overall slightly increased in size  relative to prior exam. Scattered adjacent subarachnoid hemorrhage within the left frontal and temporal lobes with subarachnoid blood in the left sylvian fissure. Overall, degree of subarachnoid hemorrhage is slightly decreased from prior. Associated edema with sulcal effacement again seen throughout the left frontal, temporal, and parietal lobes. Overall, edema is slightly worsened. There is up to 9 mm of left-to-right shift, slightly worsened from previous. Basilar cisterns remain patent. No significant uncal herniation. Again, there is intraventricular extension of blood, with overall slightly increased amount of intraventricular hemorrhage as compared to previous exam. Left lateral ventricle is partially effaced. Overall, size of the ventricles is slightly increased relative to previous exam with increased dilatation of the right lateral ventricle including the temporal horn, worrisome for possible developing in ventricular entrapment. No acute infarct. Prominent hypodensity within the anterior left temporal horn favored to be related to edema. No  extra-axial fluid collection. Atrophy was chronic small vessel ischemic changes again noted. Scalp soft tissues within normal limits. No acute abnormality about the orbits. Paranasal sinuses are clear. Nasogastric tube in place. No mastoid effusion. Calvarium intact. IMPRESSION: 1. Similar size of dominant parenchymal hematoma within the posterior left frontal region, but with slightly increased size of additional hematoma more inferiorly within the left temporal region. Associated edema is slightly worsened, with increased left-to-right shift now measuring up to 8 mm. 2. Increased volume of intraventricular hemorrhage with mildly increased ventricular dilatation. Particularly, the right lateral ventricle is most notably increased in size with increased dilatation of the temporal horn, worrisome for possible developing ventricular entrapment. 3. Interval decrease in  subarachnoid hemorrhage within the left cerebral hemisphere. Electronically Signed   By: Rise Mu M.D.   On: 10/08/2015 06:44   Dg Abd Portable 1v  10/09/2015  CLINICAL DATA:  Verify feeding tube placement EXAM: PORTABLE ABDOMEN - 1 VIEW COMPARISON:  09/22/2015 FINDINGS: Dobbhoff feeding tube has been placed with its course suggesting that it passes through the stomach and duodenum with tip in the left upper quadrant at the anticipated position of the ligament of Treitz. IMPRESSION: Dobbhoff as described Electronically Signed   By: Esperanza Heir M.D.   On: 10/09/2015 14:29   Dg Abd Portable 1v  10/06/2015  CLINICAL DATA:  Evaluate feeding tube placement EXAM: PORTABLE ABDOMEN - 1 VIEW COMPARISON:  09/13/2015 FINDINGS: The feeding tube tip is in the expected location of the pylorus. There is mild gaseous distension of the gastric lumen. No dilated loops of bowel noted. IMPRESSION: 1. Tip of the feeding tube is in the expected location of the pylorus. Electronically Signed   By: Signa Kell M.D.   On: 10/10/2015 18:53    CT of the brain  Progression and increase of intraparenchymal hemorrhage in the left with extension into the lateral ventricles bilaterally and small amount of subarachnoid hemorrhage in the sulci. Increasing white matter edema. 5 mm left-to-right midline shift with effacement of sulci on the left.  MRI of the brain  Not done  MRA of the brain  Not done  Carotid Doppler    2D Echocardiogram  Done 11/23 Systolic function was normal. The estimated ejection fraction was in the range of 55% to 60%.  CXR  09/30/2015 : . Lung volumes are low bilaterally. No infiltrate, edema or pleural fluid identified. The heart size is stable.    Therapy Recommendations pending  Physical Exam   Neuro: Mental Status: Patient is drowsy but can be aroused, globally aphasic.confused. delirious He does not follow any commands or answer any questions Cranial Nerves: II:  does not blink to threat from the right Pupils are equal, round, and reactive to light.  III,IV, VI: he appears to have a left gaze preference but does cross midline  V: response to stim bilaterally VII: Facial movement is notable for right facial weakness  VIII, X, XI, XII: Unable to assess secondary to patient's altered mental status.  Motor: Tone is possibly slightly increased in the right leg Bulk is normal. He has paralysis(0/5) of the right upper extremity with slight movement of the right lower extremity, but some weakness in the lower extremity as well, (3-4/5). He appears to move the left with good strength.  Sensory: Responds to stim x 4.  Cerebellar: Unable to assess secondary to patient's altered mental status.   ASSESSMENT 81 y.o. male who was last seen well around midnight. Subsequently his family heard a  big thump around 445 and the patient was discovered to be aphasic and hemiplegic on the right. Of note, history/admitted for intracranial hemorrhage with concern for possible cerebral amyloid. Worsening L sided hemorrhage.     LDL 220  On 11/24     Hospital day # 2  TREATMENT/PLAN  Labetalol and hydralazine PRN SBP goal under 160. No PRNs needed overnight and AM. Will hold off starting any b/p Meds  Patient pulled out NG tube.    CTH repeat showing istable hematoma size and  midline shift  Central line placed for 3% with goal of NA <155 due to slight shift and cerebral edema increased to 40cc/hr . Sodium today 150 at goal  Sodium q 6 hrs.   There is intraventricular hemorrhage but no casting of 4th or 3rd ventricle. Mild hydrocephalus. No need for EVD yet   Keppra 500 BID for acute seizure prophylaxis for total 7 days   I had  discussion with nephew over the phone. Family would like pt DNR but aggressive care for now. I explained his poor prognosis and likely need for PEG tube and nursing home placement. Family to decide if this would be acceptable and  will make a decision soon.   This patient is critically ill and at significant risk of neurological worsening, death and care requires constant monitoring of vital signs, hemodynamics,respiratory and cardiac monitoring, extensive review of multiple databases, frequent neurological assessment, discussion with family, other specialists and medical decision making of high complexity. High risk of further hemorrhage, seizures, hemodynamic instability.  35 minutes of critical care spent.    SIGNED SETHI,PRAMOD   To contact Stroke Continuity provider, please refer to [email or web address]. After hours, contact General Neurology

## 2015-10-10 LAB — SODIUM: SODIUM: 154 mmol/L — AB (ref 135–145)

## 2015-10-10 LAB — GLUCOSE, CAPILLARY
GLUCOSE-CAPILLARY: 120 mg/dL — AB (ref 65–99)
GLUCOSE-CAPILLARY: 98 mg/dL (ref 65–99)

## 2015-10-10 MED ORDER — MORPHINE SULFATE (PF) 2 MG/ML IV SOLN
1.0000 mg | INTRAVENOUS | Status: DC | PRN
  Administered 2015-10-10 – 2015-10-11 (×3): 1 mg via INTRAVENOUS
  Filled 2015-10-10 (×3): qty 1

## 2015-10-10 MED ORDER — SODIUM CHLORIDE 0.9 % IV SOLN
1.0000 mg/h | INTRAVENOUS | Status: DC
  Administered 2015-10-10: 1 mg/h via INTRAVENOUS
  Filled 2015-10-10: qty 10

## 2015-10-10 NOTE — Progress Notes (Signed)
PT Cancellation and Discharge Note  Patient Details Name: Walter FastRobert J [name] MRN: [id number] DOB: 1925-06-24   Cancelled Treatment:    Reason Eval/Treat Not Completed: PT screened, no needs identified, will sign off.  Noted plan is for comfort at this time.  Will sign off at this time.     Heylee Tant, Alison MurrayMegan F 10/10/2015, 11:49 AM

## 2015-10-10 NOTE — Progress Notes (Signed)
OT Cancellation Note  Patient Details Name: Walter FastRobert J [name] MRN: [id number] DOB: March 25, 1925   Cancelled Treatment:    Reason Eval/Treat Not Completed: Pt with medical decline and not appropriate for OT evaluation at this time. OT to sign off acutely.   Felecia ShellingJones, Greysyn Vanderberg B   Teela Narducci, Brynn   OTR/L Pager: [phone] Office: [phone] .  10/10/2015, 12:17 PM

## 2015-10-10 NOTE — Progress Notes (Addendum)
Stroke Team Progress Note  HISTORY 81 y.o. male who was last seen well around midnight. Subsequently his family heard a big thump around 445 and the patient was discovered to be aphasic and hemiplegic on the right. Of note, history/admitted for intracranial hemorrhage with concern for possible cerebral amyloid. There have been plans to repeat imaging after resolution of the hemorrhage, but this has not occurred as of yet.  SUBJECTIVE Appears to be aphasic and right hemipareis. Repeat [facility]  10/09/15 with persistent edema and midline shift.    but no significant change. Patient's health power of attorney nephew is available at the bedside for discussion today OBJECTIVE Most recent Vital Signs: Filed Vitals:   10/10/15 0900 10/10/15 1000 10/10/15 1100 10/10/15 1147  BP: 163/86 173/97  174/75  Pulse: 95 88 110 90  Temp:    98.6 F (37 C)  TempSrc:    Oral  Resp: Height:      Weight:      SpO2: 96% 97% 99% 97%   CBG (last 3)   Recent Labs  10/09/15 2316 10/10/15 0320 10/10/15 0807  GLUCAP 141* 120* 98    IV Fluid Intake:      MEDICATIONS    PRN:  morphine injection  Diet:    Activity:  Bedrest DVT Prophylaxis:  SCDs  CLINICALLY SIGNIFICANT STUDIES Basic Metabolic Panel:  Recent Labs Lab 10/06/2015 0624 09/26/2015 0630  10/09/15 0555  10/09/15 2325 10/10/15 0545  NA 138 139  < > 147*  < > 153* 154*  K 4.4 4.3  --  3.2*  --   --   --   CL 103 104  --  113*  --   --   --   CO2 22  --   --  25  --   --   --   GLUCOSE 162* 162*  --  133*  --   --   --   BUN 30* 30*  --  23*  --   --   --   CREATININE 1.37* 1.20  --  1.18  --   --   --   CALCIUM 8.9  --   --  8.8*  --   --   --   < > = values in this interval not displayed. Liver Function Tests:   Recent Labs Lab 10/09/2015 0624  AST 21  ALT 13*  ALKPHOS 77  BILITOT 0.7  PROT 6.8  ALBUMIN 3.6   CBC:   Recent Labs Lab 09/22/2015 0624 09/26/2015 0630 10/09/15 0555  WBC 9.9  --  9.6  NEUTROABS 8.5*   --   --   HGB 11.6* 12.2* 10.1*  HCT 36.0* 36.0* 31.4*  MCV 90.0  --  91.8  PLT 178  --  155   Coagulation:   Recent Labs Lab 10/04/2015 0624  LABPROT 13.4  INR 1.00   Cardiac Enzymes: No results for input(s): CKTOTAL, CKMB, CKMBINDEX, TROPONINI in the last 168 hours. Urinalysis:   Recent Labs Lab 09/26/2015 0659  COLORURINE YELLOW  LABSPEC 1.012  PHURINE 7.0  GLUCOSEU NEGATIVE  HGBUR NEGATIVE  BILIRUBINUR NEGATIVE  KETONESUR NEGATIVE  PROTEINUR NEGATIVE  NITRITE NEGATIVE  LEUKOCYTESUR NEGATIVE   Lipid Panel    Component Value Date/Time   CHOL 286* 09/14/2015 0446   TRIG 103 09/14/2015 0446   HDL 45 09/14/2015 0446   CHOLHDL 6.4 09/14/2015 0446   VLDL 21 09/14/2015 0446   LDLCALC 220* 09/14/2015 0446  HgbA1C  Lab Results  Component Value Date   HGBA1C 6.0* 09/14/2015    Urine Drug Screen:      Component Value Date/Time   LABOPIA NONE DETECTED 06/08/2015 0659   COCAINSCRNUR NONE DETECTED 06/08/2015 0659   LABBENZ NONE DETECTED 06/08/2015 0659   AMPHETMU NONE DETECTED 06/08/2015 0659   THCU NONE DETECTED 06/08/2015 0659   LABBARB NONE DETECTED 06/08/2015 0659    Alcohol Level:   Recent Labs Lab 06/05/2015 0624  ETH <5    Ct Head Wo Contrast  10/09/2015  CLINICAL DATA:  Followup intracranial hemorrhage. EXAM: CT HEAD WITHOUT CONTRAST TECHNIQUE: Contiguous axial images were obtained from the base of the skull through the vertex without intravenous contrast. COMPARISON:  Prior CT from 10/08/2015 as well as earlier studies. FINDINGS: Study degraded by motion artifact. Parenchymal hematoma centered within the posterior left frontal lobe overall similar in size, measuring 4.9 x 5.0 cm on this exam. Differences in measurement likely related to patient positioning and gantry angulation. Additional hematoma within the left temporal lobe is overall stable measuring 4.1 cm. Scattered subarachnoid hemorrhage within the left frontotemporal region slightly decreased.  Associated vasogenic edema not significantly changed. Associated left-to-right shift measures approximately 8 mm, stable. Intraventricular extension of hemorrhage again seen. Overall, volume of intraventricular blood is slightly decreased. Ventricular dilatation grossly stable. Again, there is mild asymmetric dilatation of the right lateral ventricle. No overt hydrocephalus. Basilar cisterns remain patent. Mild left uncal herniation. Atrophy with chronic microvascular ischemic disease again noted. No acute large vessel territory infarct. No extra-axial fluid collection. Scalp soft tissues within normal limits. No definite acute abnormality about the orbits. Paranasal sinuses are grossly clear. No definite mastoid effusion. Calvarium grossly stable. IMPRESSION: Overall little interval change. Stable size of left frontal and temporal hematomas with similar vasogenic edema and 8 mm of left-to-right shift. Intraventricular blood and subarachnoid hemorrhage is slightly decreased. Ventricular dilatation is similar. Electronically Signed   By: Rise MuBenjamin  McClintock M.D.   On: 10/09/2015 06:27   Dg Abd Portable 1v  10/09/2015  CLINICAL DATA:  Verify feeding tube placement EXAM: PORTABLE ABDOMEN - 1 VIEW COMPARISON:  06/08/2015 FINDINGS: Dobbhoff feeding tube has been placed with its course suggesting that it passes through the stomach and duodenum with tip in the left upper quadrant at the anticipated position of the ligament of Treitz. IMPRESSION: Dobbhoff as described Electronically Signed   By: Esperanza Heiraymond  Rubner M.D.   On: 10/09/2015 14:29    CT of the brain  Progression and increase of intraparenchymal hemorrhage in the left with extension into the lateral ventricles bilaterally and small amount of subarachnoid hemorrhage in the sulci. Increasing white matter edema. 5 mm left-to-right midline shift with effacement of sulci on the left.  MRI of the brain  Not done  MRA of the brain  Not done  Carotid  Doppler    2D Echocardiogram  Done 11/23 Systolic function was normal. The estimated ejection fraction was in the range of 55% to 60%.  CXR  06/05/2015 : . Lung volumes are low bilaterally. No infiltrate, edema or pleural fluid identified. The heart size is stable.    Therapy Recommendations pending  Physical Exam   Neuro: Mental Status: Patient is drowsy but can be aroused, globally aphasic.confused. delirious He does not follow any commands or answer any questions Cranial Nerves: II: does not blink to threat from the right Pupils are equal, round, and reactive to light.  III,IV, VI: he appears to have a left gaze preference  but does cross midline  V: response to stim bilaterally VII: Facial movement is notable for right facial weakness  VIII, X, XI, XII: Unable to assess secondary to patient's altered mental status.  Motor: Tone is possibly slightly increased in the right leg Bulk is normal. He has paralysis(0/5) of the right upper extremity with slight movement of the right lower extremity, but some weakness in the lower extremity as well, (3-4/5). He appears to move the left with good strength.  Sensory: Responds to stim x 4.  Cerebellar: Unable to assess secondary to patient's altered mental status.   ASSESSMENT 81 y.o. admitted for intracranial hemorrhage with concern for possible cerebral amyloid. Worsening L sided hemorrhage.   Altered mental status secondary to encephalopathy from hemorrhagic stroke  LDL 220  On 11/24     Hospital day # 3  TREATMENT/PLAN  Labetalol and hydralazine PRN SBP goal under 160. No PRNs needed overnight and AM. Will hold off starting any b/p Meds  Patient pulled out NG tube x 2.    CTH repeat 10/09/15 showing stable hematoma size and  midline shift  Central line placed for 3% with goal of NA <155 due to slight shift and cerebral edema increased to 40cc/hr . Sodium today 154 at goal  Sodium q 6 hrs.   There is  intraventricular hemorrhage but no casting of 4th or 3rd ventricle. Mild hydrocephalus. No need for EVD yet   Keppra 500 BID for acute seizure prophylaxis for total 7 days   I had  Long discussion with nephew Mr [place] at the bedside who is his HPOA.Family would like pt DNR  . I explained his poor prognosis and likely need for PEG tube and nursing home placement. Family has decided that this would  not be acceptable to the patient and want  comfort care and hospice only.Marland Kitchen Hence we will discontinue [place] level care and transfer to hospice bed. Anticipate his survival to be several days and hence may need to eventually transfer to hospice nursing home.   This patient is critically ill and at significant risk of neurological worsening, death and care requires constant monitoring of vital signs, hemodynamics,respiratory and cardiac monitoring, extensive review of multiple databases, frequent neurological assessment, discussion with family, other specialists and medical decision making of high complexity. High risk of further hemorrhage, seizures, hemodynamic instability.  35 minutes of critical care spent.    SIGNED SETHI,PRAMOD   To contact Stroke Continuity provider, please refer to [email or web address]. After hours, contact General Neurology

## 2015-10-10 NOTE — Care Management Note (Signed)
Case Management Note  Patient Details  Name: Walter FastRobert J [name] MRN: [id number] Date of Birth: 27-Aug-1925  Subjective/Objective:   Pt admitted on 09/25/2015 s/p fall with large ICH.  PTA, pt resided at home with family.                   Action/Plan: Family has chosen to offer comfort care, and pt is transferring to [address] [place].    Expected Discharge Date:                  Expected Discharge Plan:     In-House Referral:     Discharge planning Services   CM referral  Post Acute Care Choice:    Choice offered to:     DME Arranged:    DME Agency:     HH Arranged:    HH Agency:     Status of Service:   In process, will continue to follow  Medicare Important Message Given:    Date Medicare IM Given:    Medicare IM give by:    Date Additional Medicare IM Given:    Additional Medicare Important Message give by:     If discussed at Long Length of Stay Meetings, dates discussed:    Additional Comments:  Quintella BatonJulie W. Aceton Kinnear, RN, BSN  Trauma/Neuro ICU Case Manager [phone]

## 2015-10-10 NOTE — Progress Notes (Signed)
SLP Cancellation Note  Patient Details Name: Walter FastRobert J [name] MRN: [id number] DOB: 12-04-1924   Cancelled treatment:       Reason Eval/Treat Not Completed: Other (comment)Per RN, pt is being transferred from unit and to a comfort approach.  SLP services will sign off.   Blenda MountsCouture, Akul Leggette Laurice 10/10/2015, 11:04 AM

## 2015-10-10 NOTE — Care Management Important Message (Signed)
Important Message  Patient Details  Name: Walter FastRobert J [name] MRN: [id number] Date of Birth: 1925/05/10   Medicare Important Message Given:  Yes    Kyla BalzarineShealy, [name] 10/10/2015, 5:01 PM

## 2015-10-10 NOTE — Clinical Social Work Note (Signed)
CSW received referral for possible residential hospice placement at time of discharge. CSW consulted with patient's RN and PA.  CSW to follow up with patient's nephew on 07-23-2015.  Walter Deistmily Olivia Royse, LCSW [phone] Orthopedics: [phone] Surgical: [id number]

## 2015-10-11 ENCOUNTER — Encounter (HOSPITAL_COMMUNITY): Payer: Self-pay

## 2015-10-11 NOTE — Clinical Social Work Note (Signed)
CSW contacted by patient's PA regarding discharge disposition. Per PA, patient's family understanding and agreeable to residential hospice placement [facility].  CSW made appropriate referrals to residential hospice facilities. Awaiting bed availability.  Marcelline Deistmily Tomasina Keasling, LCSW [phone] Orthopedics: [phone] Surgical: [phone]

## 2015-10-11 NOTE — Progress Notes (Signed)
Stroke Team Progress Note  HISTORY 81 y.o. male who was last seen well around midnight. Subsequently his family heard a big thump around 445 and the patient was discovered to be aphasic and hemiplegic on the right. Of note, history/admitted for intracranial hemorrhage with concern for possible cerebral amyloid. There have been plans to repeat imaging after resolution of the hemorrhage, but this has not occurred as of yet.  SUBJECTIVE Patient made comfort care yesterday. She is on morphine drip and is resting peacefully. I spoke to nephew-health power of attorney nephew over the phone and his motherat the bedside and recommended transfer to hospice nursing facility close to the patient's home and they are in agreement  OBJECTIVE Most recent Vital Signs: Filed Vitals:   10/10/15 1100 10/10/15 1147 10/10/15 1400 10/01/2015 0449  BP:  174/75 162/82 79/41  Pulse: 110 90 92 110  Temp:  98.6 F (37 C) 97.7 F (36.5 C) 100.9 F (38.3 C)  TempSrc:  Oral Oral Oral  Resp: 18 20 18 15   Height:      Weight:      SpO2: 99% 97% 99% 74%   CBG (last 3)   Recent Labs  10/09/15 2316 10/10/15 0320 10/10/15 0807  GLUCAP 141* 120* 98    IV Fluid Intake:   . morphine 1 mg/hr (10/10/15 1600)    MEDICATIONS    PRN:  morphine injection  Diet:    Activity:  Bedrest DVT Prophylaxis:  SCDs  CLINICALLY SIGNIFICANT STUDIES Basic Metabolic Panel:  Recent Labs Lab 11-25-2014 0624 11-25-2014 0630  10/09/15 0555  10/09/15 2325 10/10/15 0545  NA 138 139  < > 147*  < > 153* 154*  K 4.4 4.3  --  3.2*  --   --   --   CL 103 104  --  113*  --   --   --   CO2 22  --   --  25  --   --   --   GLUCOSE 162* 162*  --  133*  --   --   --   BUN 30* 30*  --  23*  --   --   --   CREATININE 1.37* 1.20  --  1.18  --   --   --   CALCIUM 8.9  --   --  8.8*  --   --   --   < > = values in this interval not displayed. Liver Function Tests:   Recent Labs Lab 11-25-2014 0624  AST 21  ALT 13*  ALKPHOS 77  BILITOT  0.7  PROT 6.8  ALBUMIN 3.6   CBC:   Recent Labs Lab 11-25-2014 0624 11-25-2014 0630 10/09/15 0555  WBC 9.9  --  9.6  NEUTROABS 8.5*  --   --   HGB 11.6* 12.2* 10.1*  HCT 36.0* 36.0* 31.4*  MCV 90.0  --  91.8  PLT 178  --  155   Coagulation:   Recent Labs Lab 11-25-2014 0624  LABPROT 13.4  INR 1.00   Cardiac Enzymes: No results for input(s): CKTOTAL, CKMB, CKMBINDEX, TROPONINI in the last 168 hours. Urinalysis:   Recent Labs Lab 11-25-2014 0659  COLORURINE YELLOW  LABSPEC 1.012  PHURINE 7.0  GLUCOSEU NEGATIVE  HGBUR NEGATIVE  BILIRUBINUR NEGATIVE  KETONESUR NEGATIVE  PROTEINUR NEGATIVE  NITRITE NEGATIVE  LEUKOCYTESUR NEGATIVE   Lipid Panel    Component Value Date/Time   CHOL 286* 09/14/2015 0446   TRIG 103 09/14/2015 0446   HDL  45 09/14/2015 0446   CHOLHDL 6.4 09/14/2015 0446   VLDL 21 09/14/2015 0446   LDLCALC 220* 09/14/2015 0446   HgbA1C  Lab Results  Component Value Date   HGBA1C 6.0* 09/14/2015    Urine Drug Screen:      Component Value Date/Time   LABOPIA NONE DETECTED 10/08/15 0659   COCAINSCRNUR NONE DETECTED 2015/10/08 0659   LABBENZ NONE DETECTED October 08, 2015 0659   AMPHETMU NONE DETECTED 10/08/2015 0659   THCU NONE DETECTED 2015-10-08 0659   LABBARB NONE DETECTED 10/08/2015 0659    Alcohol Level:   Recent Labs Lab 10-08-2015 0624  ETH <5    No results found.  CT of the brain  Progression and increase of intraparenchymal hemorrhage in the left with extension into the lateral ventricles bilaterally and small amount of subarachnoid hemorrhage in the sulci. Increasing white matter edema. 5 mm left-to-right midline shift with effacement of sulci on the left.  MRI of the brain  Not done  MRA of the brain  Not done  Carotid Doppler    2D Echocardiogram  Done 11/23 Systolic function was normal. The estimated ejection fraction was in the range of 55% to 60%.  CXR  10/08/2015 : . Lung volumes are low bilaterally. No infiltrate,  edema or pleural fluid identified. The heart size is stable.    Therapy Recommendations pending  Physical Exam   Neuro: Mental Status: Patient is stuporose cannot be aroused,   She does not follow any commands or answer any questions Cranial Nerves: II: does not blink to threat from the right Pupils are equal, round, and reactive to light.  III,IV, VI: he appears to have a left gaze preference but does cross midline  V: response to stim bilaterally VII: Facial movement is notable for right facial weakness  VIII, X, XI, XII: Unable to assess secondary to patient's altered mental status.  Motor: Tone is possibly slightly increased in the right leg Bulk is normal. He has paralysis(0/5) of the right upper extremity with slight movement of the right lower extremity, but some weakness in the lower extremity as well, (3-4/5). He appears to move the left with good strength.  Sensory: Responds to stim x 4.  Cerebellar: Unable to assess secondary to patient's altered mental status.   ASSESSMENT 81 y.o. male who was last seen well around midnight. Subsequently his family heard a big thump around 445 and the patient was discovered to be aphasic and hemiplegic on the right. Of note, history/admitted for intracranial hemorrhage with concern for possible cerebral amyloid. Worsening L sided hemorrhage.     LDL 220  On 11/24     Hospital day # 4  TREATMENT/PLAN  Patient is now for comfort care and resting peacefully on morphine drip. I had a telephone discussion with patient's nephew health power of attorney who agrees with looking for transfer to nursing facilities in [place] to be close to his home. Social worker has been contacted to arrange this. SIGNED Jatavious Peppard   To contact Stroke Continuity provider, please refer to [email or web address]. After hours, contact General Neurology

## 2015-10-11 NOTE — Progress Notes (Signed)
Nutrition Brief Note  Chart reviewed. Pt now transitioning to comfort care.  No further nutrition interventions warranted at this time.  Please re-consult as needed.   Aayat Hajjar A. Bane Hagy, RD, LDN, CDE Pager: [phone] After hours Pager: [phone]  

## 2015-10-22 NOTE — Progress Notes (Signed)
Wasted 180ml of  Morphine in the sink witnessed by Chuck HintAngelo Brickhouse, RN

## 2015-10-22 NOTE — Progress Notes (Signed)
Pt expired at 04/14/2015 23:25. Pronounced with Earmon PhoenixAngelo Brickhouse,RN. Family member(Nephew) at bedside. Notified on call MD (Dr. Pricilla HandlerP.Sumner), called [facility] and [facility].

## 2015-10-22 DEATH — deceased

## 2015-10-25 NOTE — Progress Notes (Signed)
Death certificate receive to [facility].

## 2015-10-27 ENCOUNTER — Telehealth: Payer: Self-pay | Admitting: *Deleted

## 2015-10-27 NOTE — Telephone Encounter (Signed)
[facility]  [facility]27[facility].

## 2015-10-27 NOTE — Progress Notes (Signed)
Death certificate complete and sent to Medical records.

## 2015-11-22 NOTE — Discharge Summary (Signed)
Patient ID: Walter FastRobert J [name] MRN: [id number] DOB/AGE: [birth year] 82 y.o.  Admit date: 2014-12-17 Death date: 10/01/2015 23:25  Admission Diagnoses: Aphasia  Cause of Death:  Respiratory failure secondary to increased intracranial pressure from brain herniation due to large left parenchymal brain hemorrhage etiology indeterminate possibly amyloid angiopathy. Patient made DO NOT RESUSCITATE and comfort care by family  Pertinent Medical Diagnosis: Active Problems:   ICH (intracerebral hemorrhage) (HCC) - trauma vs cerebral amyloid angiopathy   Cerebral edema (HCC)   Hemorrhagic stroke [facility](HCC)   Hospital Course: Walter FastRobert J [name] is a 82 y.o. male who was last seen well around midnight. Subsequently his family heard a big thump around 445 and the patient was discovered to be aphasic and hemiplegic on the right. Of note, history/admitted for intracranial hemorrhage with concern for possible cerebral amyloid.  Arrival to emergency room, code stroke was activated the patient was taken to CT scanner which showed new impingement, hemorrhage in the same region, but different location from the previous ICH.  LKW: Midnight tpa given?: no, hemorrhage.. Patient was admitted to the neurological intensive care unit. He remained somnolent and unresponsive. Repeat CT scan of the head showed progression and increase of intraparenchymal hemorrhage on the left with extension into the lateral lateral ventricles bilaterally and small amount of subarachnoid hemorrhage with increasing white matter edema and 5 mm left-to-right midline shift. The patient was initially a full code and plans were to use hypertonic saline to control edema and start Keppra for seizure prophylaxis and strict blood pressure control. However since his neurological exam declined prognosis was poor after several discussions with the family they agreed to make the patient DO NOT RESUSCITATE and comfort care and ventilatory support was withdrawn.  Patient was made full comfort care and transfer to the hospice unit and kept on morphine drip and comfortable. He peacefully passed away.  Signed: Caterina Racine 10/27/2015, 12:04 PM

## 2015-11-27 ENCOUNTER — Institutional Professional Consult (permissible substitution): Payer: Medicare Other | Admitting: Neurology

## 2017-06-21 IMAGING — DX DG ABDOMEN 1V
1 series · 1 of 1 positions shown · non-contrast
Comparison: None.

CLINICAL DATA: Pre MRI.  Looking for metal.

EXAM:
ABDOMEN - 1 VIEW

[t abdomen supine]
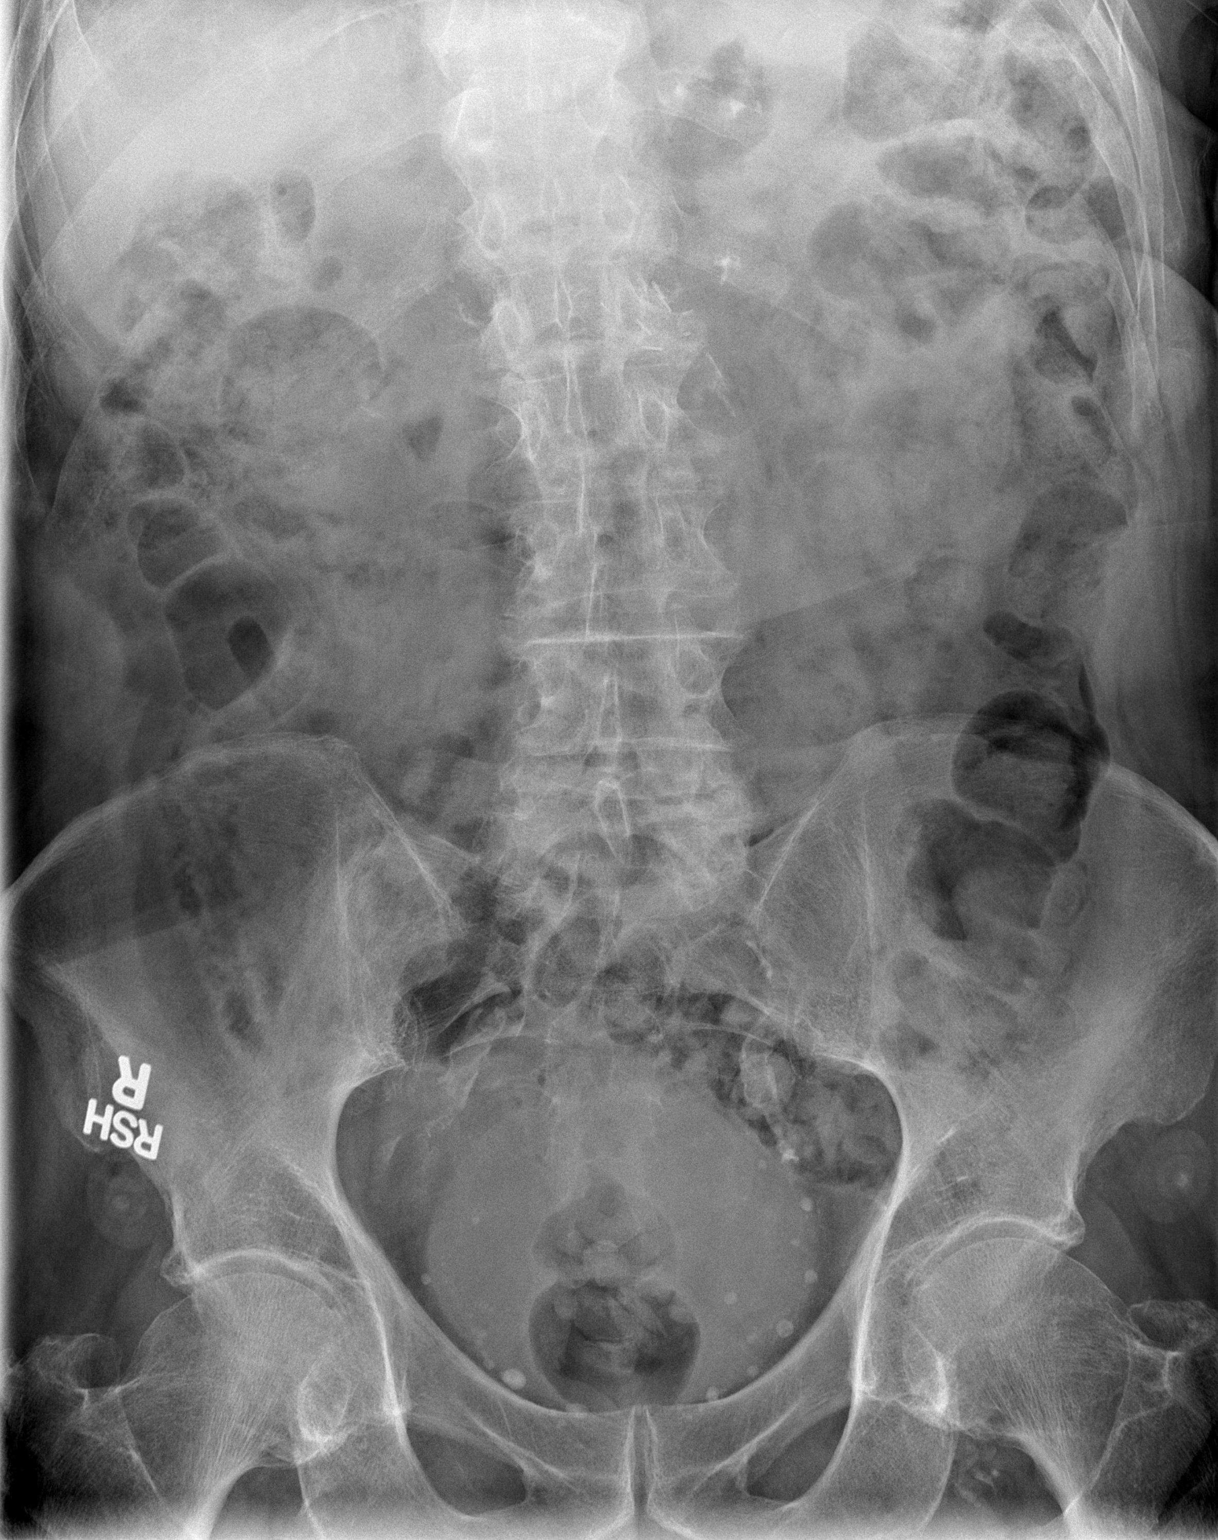

[1 of 1 positions shown; findings below may reference images not displayed]

FINDINGS: The bowel gas pattern is normal. Phleboliths are noted in the
pelvis.
IMPRESSION: No evidence of bowel obstruction or ileus. No radiopaque foreign
body seen.

## 2017-06-21 IMAGING — DX DG ORBITS FOR FOREIGN BODY
2 series · 2 of 2 positions shown · non-contrast
Comparison: None.

CLINICAL DATA: Metal working/exposure; clearance prior to MRI

EXAM:
ORBITS FOR FOREIGN BODY - 2 VIEW

[t waters ap (1 of 2)]
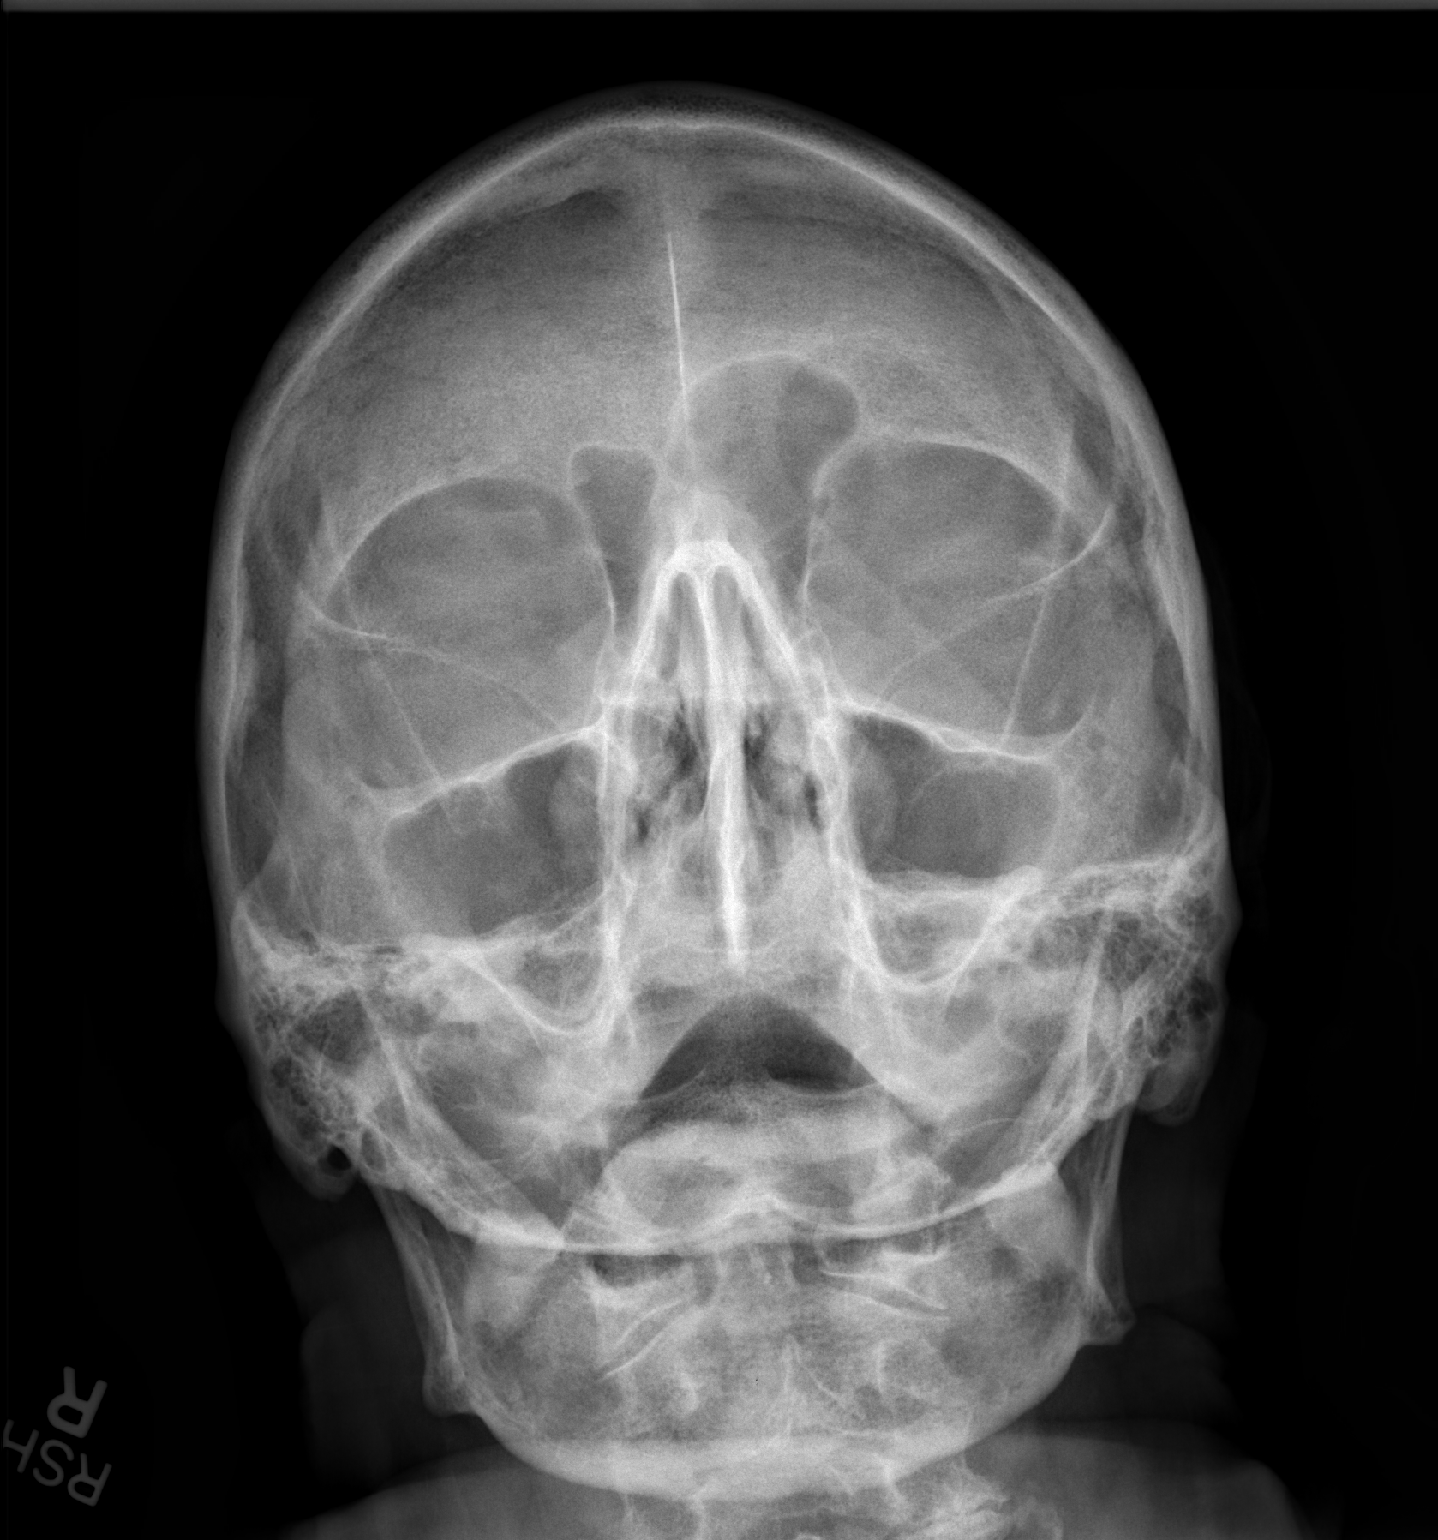

[t waters ap (2 of 2)]
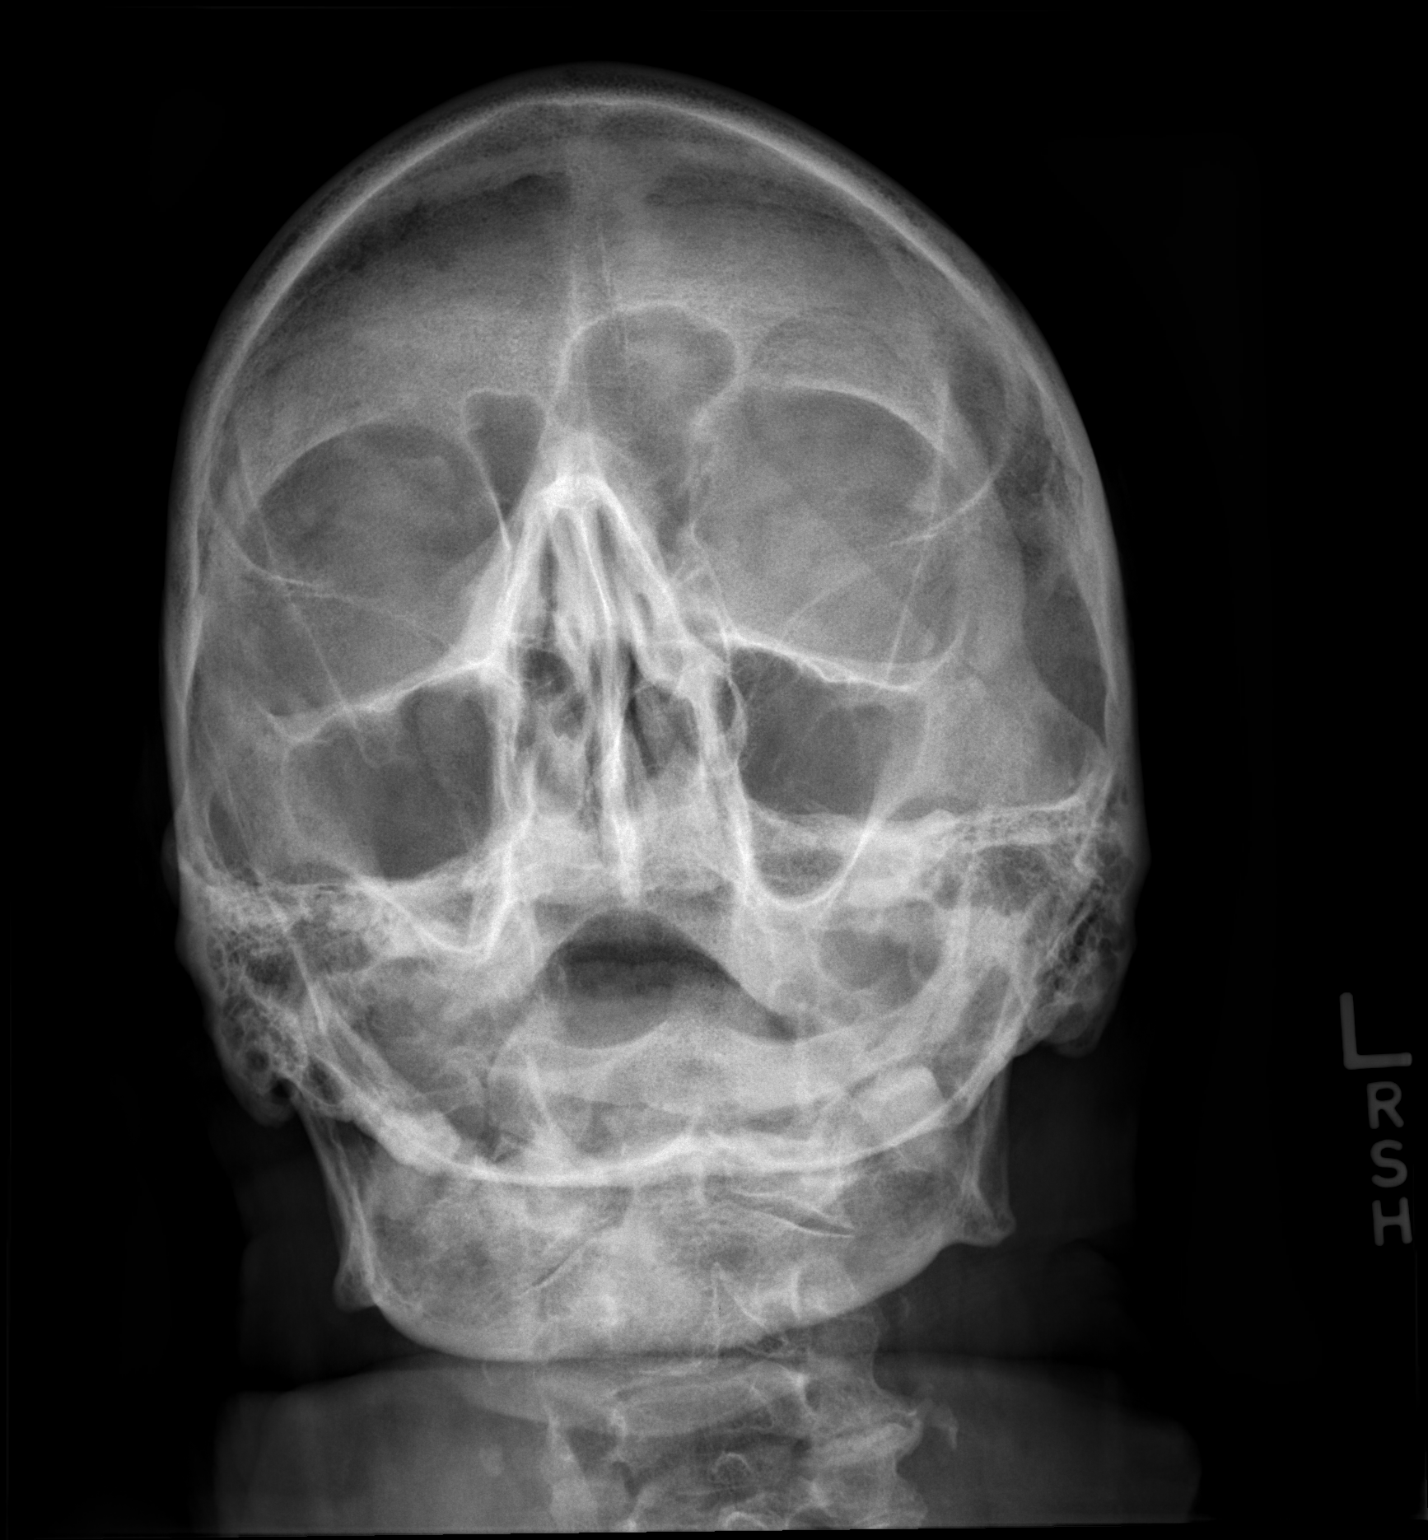

[2 of 2 positions shown; findings below may reference images not displayed]

FINDINGS: There is no evidence of metallic foreign body within the orbits. No
significant bone abnormality identified.
IMPRESSION: No evidence of metallic foreign body within the orbits.

## 2017-06-21 IMAGING — CT CT HEAD W/O CM
2 series · 15 of 30 positions shown, 19 images · non-contrast
Comparison: Head CT 09/12/2015 at [REDACTED].

CLINICAL DATA: Intra cerebral hemorrhage.

EXAM:
CT HEAD WITHOUT CONTRAST
TECHNIQUE: Contiguous axial images were obtained from the base of the skull
through the vertex without intravenous contrast.

[Series 201: head w/o, idose (1) · axial · non-contrast · 0.49mm/px · z∈[+201,+331]mm · 13 of 32 slices shown, 17 images]
[im 3/32  brain]
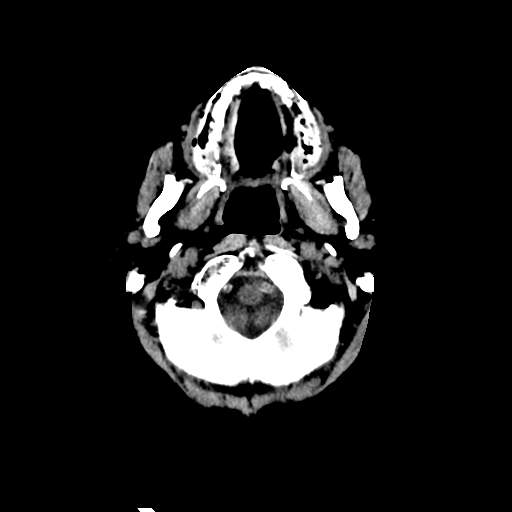
[im 3/32  bone]
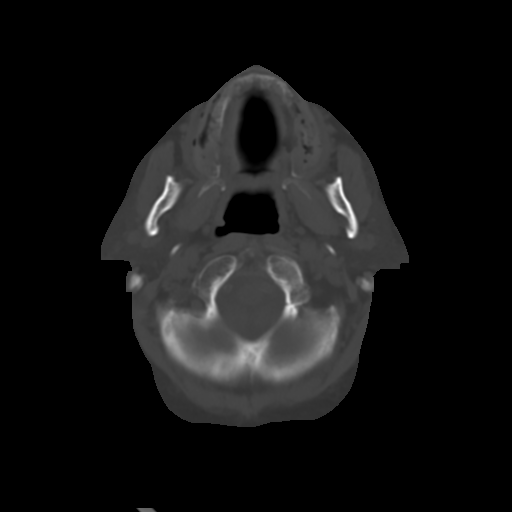
[im 5/32  brain]
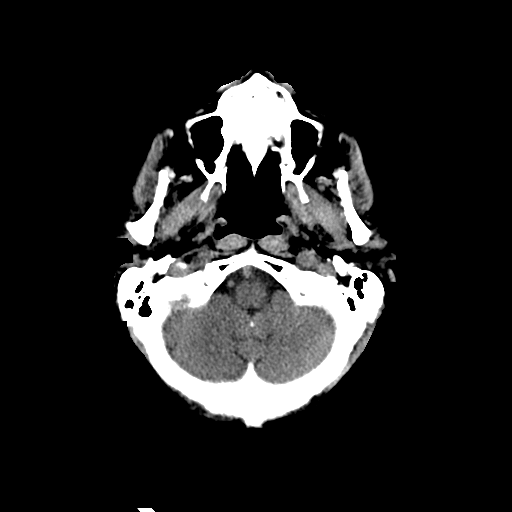
[im 7/32  brain]
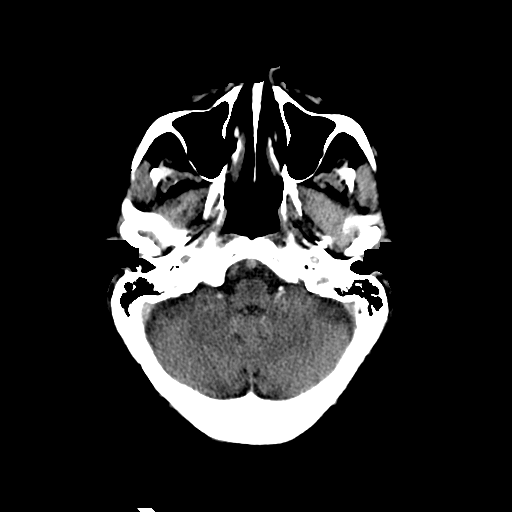
[im 9/32  brain]
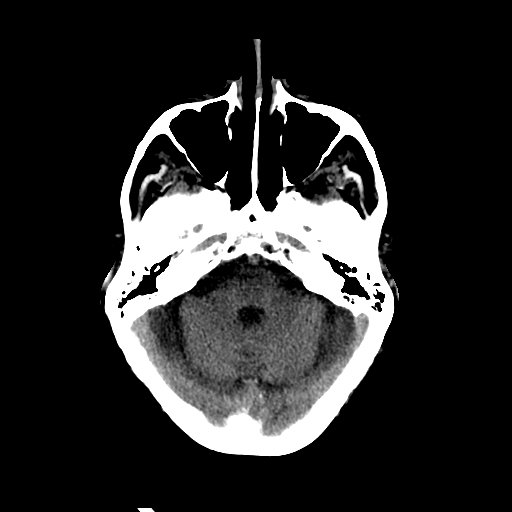
[im 12/32  brain]
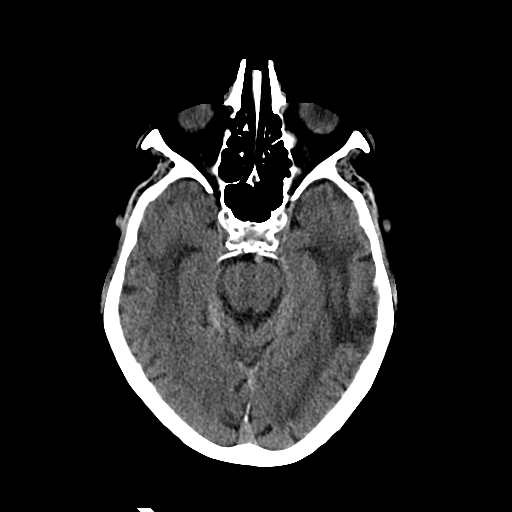
[im 12/32  bone]
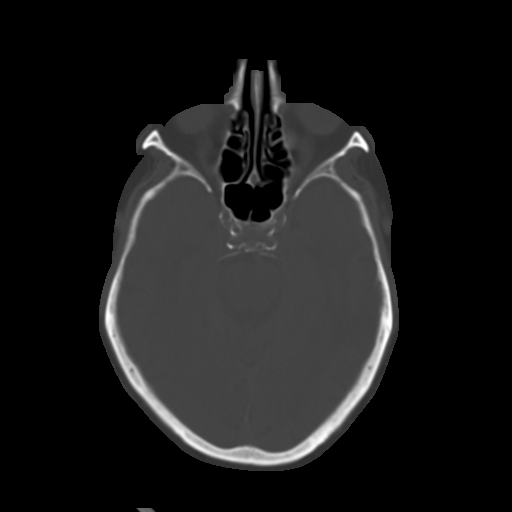
[im 14/32  brain]
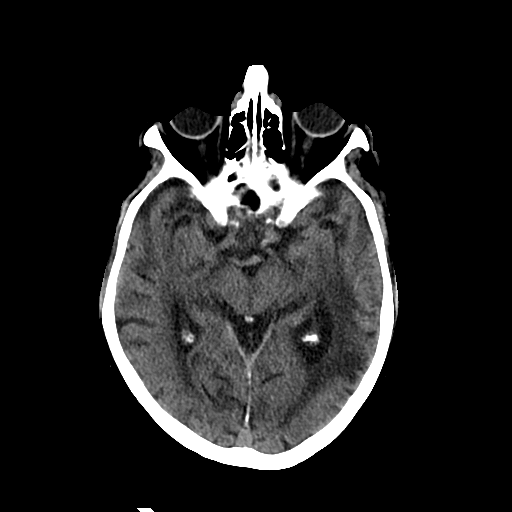
[im 16/32  brain]
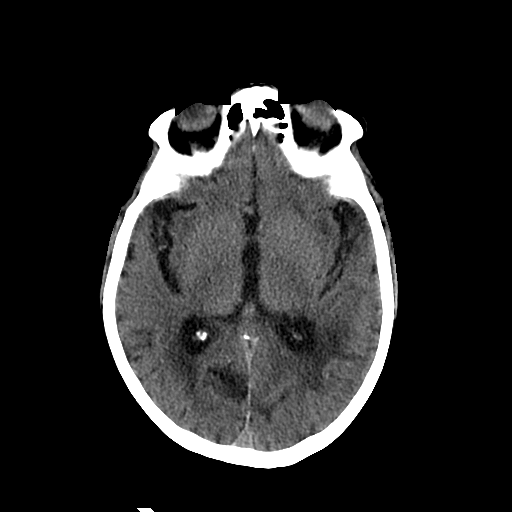
[im 18/32  brain]
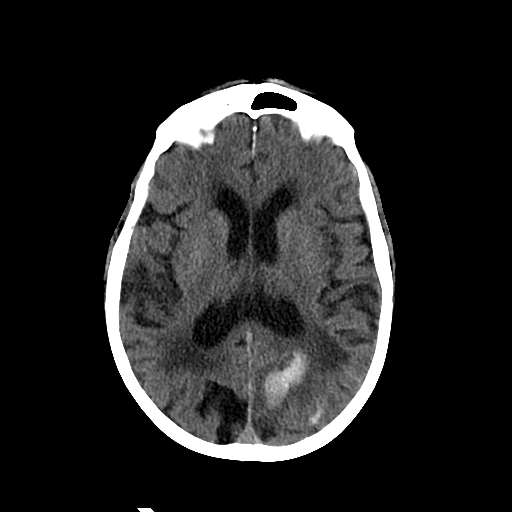
[im 20/32  brain]
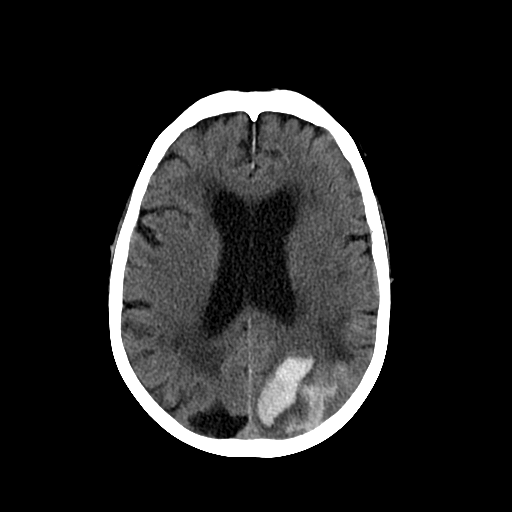
[im 20/32  bone]
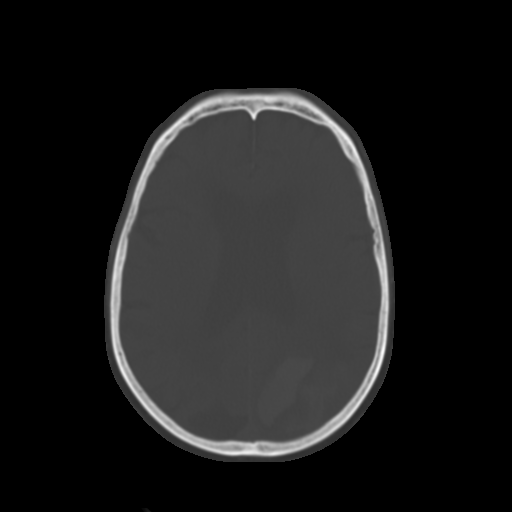
[im 23/32  brain]
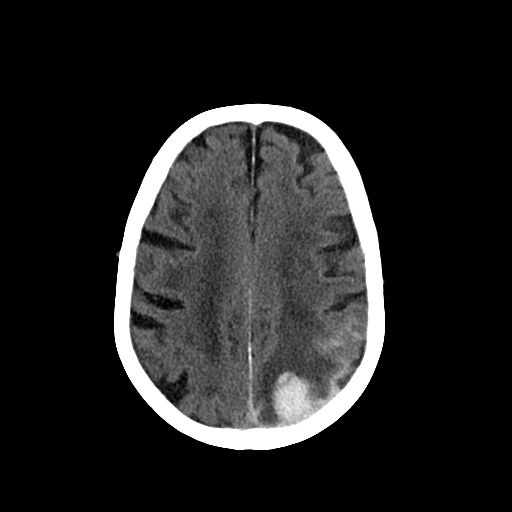
[im 25/32  brain]
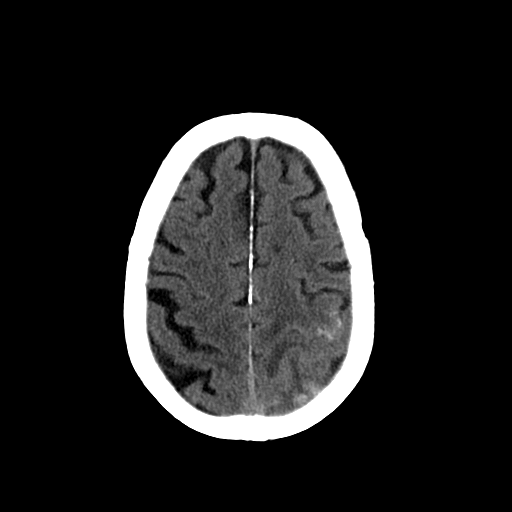
[im 27/32  brain]
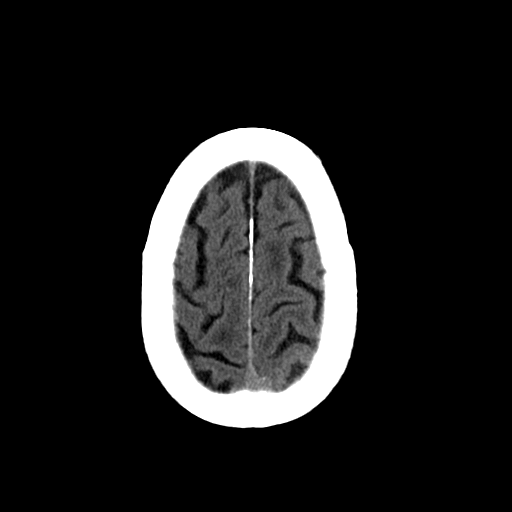
[im 29/32  brain]
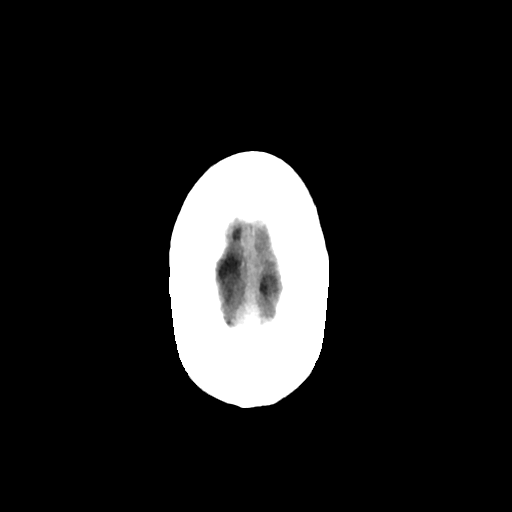
[im 29/32  bone]
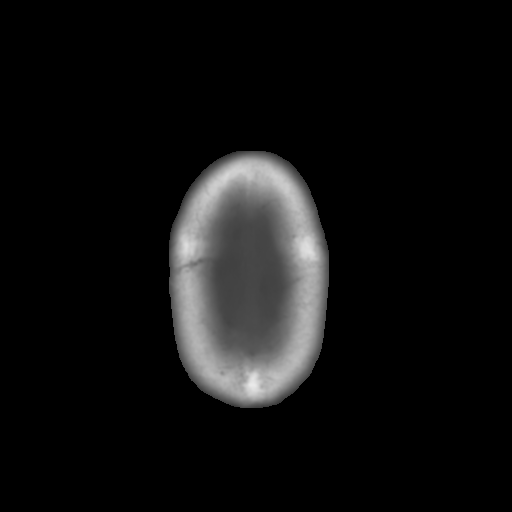

[Series 202: head w/o bone, idose (1) · axial · non-contrast · 0.49mm/px · z∈[+201,+221]mm · 2 of 32 slices shown]
[im 3/32  bone]
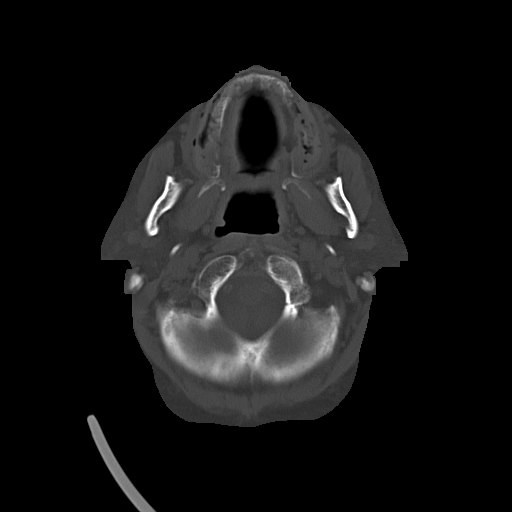
[im 7/32  bone]
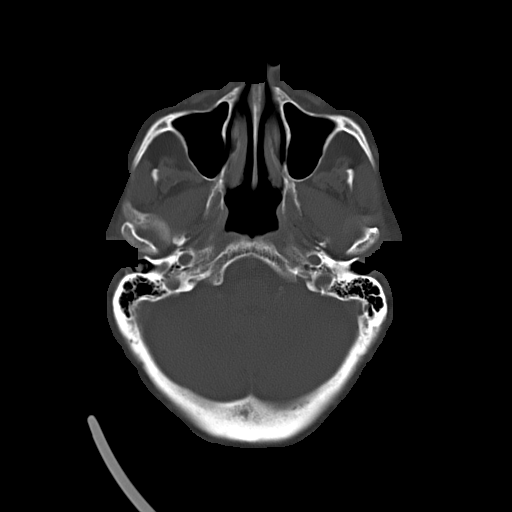

[15 of 30 positions shown; findings below may reference images not displayed]

FINDINGS: Skull and Sinuses:No traumatic finding.

Orbits: Bilateral cataract resection.  No acute finding

Brain: Parenchymal hematoma spanning the left parietal and occipital
lobes is more full along its posterior and lower margin but has
overall similar extent with maximal dimensions of 30 x 40 x 35 mm
(volume calculation confounded by irregular shape). Regional
subarachnoid hemorrhage is stable. Surrounding brain low-density is
stable, presumably reactive edema, without shift. No definitive
underlying cause, but there was cortical hemorrhage in the posterior
left temporal cortex in 6288. No hydrocephalus. No other site of
acute intracranial hemorrhage. There is generalized atrophy and
extensive chronic small vessel disease.
IMPRESSION: 1. Increased fullness of a 
 4 cm left posterior cerebral hematoma
compared to yesterday, as described above, without increased mass
effect or shift. Regional subarachnoid extension remains small
volume.
2. Left temporal gliosis from previous lobar hemorrhage, question
amyloid angiopathy.
